# Patient Record
Sex: Female | Born: 1935 | Race: Black or African American | Hispanic: No | Smoking: Never smoker
Health system: Southern US, Community
[De-identification: ages and names within clinical notes are randomized; demographics above are authoritative.]

## PROBLEM LIST (undated history)

## (undated) DIAGNOSIS — F0280 Dementia in other diseases classified elsewhere without behavioral disturbance: Secondary | ICD-10-CM

## (undated) DIAGNOSIS — R339 Retention of urine, unspecified: Secondary | ICD-10-CM

## (undated) DIAGNOSIS — G309 Alzheimer's disease, unspecified: Secondary | ICD-10-CM

## (undated) DIAGNOSIS — I10 Essential (primary) hypertension: Secondary | ICD-10-CM

## (undated) HISTORY — DX: Alzheimer's disease, unspecified: G30.9

## (undated) HISTORY — DX: Essential (primary) hypertension: I10

## (undated) HISTORY — DX: Dementia in other diseases classified elsewhere, unspecified severity, without behavioral disturbance, psychotic disturbance, mood disturbance, and anxiety: F02.80

---

## 2012-08-13 ENCOUNTER — Encounter: Payer: Self-pay | Admitting: *Deleted

## 2012-08-13 ENCOUNTER — Ambulatory Visit: Payer: Self-pay | Admitting: Nurse Practitioner

## 2012-09-03 ENCOUNTER — Encounter (HOSPITAL_COMMUNITY): Payer: Self-pay | Admitting: Emergency Medicine

## 2012-09-03 ENCOUNTER — Emergency Department (HOSPITAL_COMMUNITY)
Admission: EM | Admit: 2012-09-03 | Discharge: 2012-09-03 | Disposition: A | Discharge status: Home / Self Care | Payer: Medicare Other | Attending: Emergency Medicine | Admitting: Emergency Medicine

## 2012-09-03 DIAGNOSIS — R112 Nausea with vomiting, unspecified: Secondary | ICD-10-CM | POA: Insufficient documentation

## 2012-09-03 DIAGNOSIS — R109 Unspecified abdominal pain: Secondary | ICD-10-CM | POA: Insufficient documentation

## 2012-09-03 DIAGNOSIS — F0390 Unspecified dementia without behavioral disturbance: Secondary | ICD-10-CM | POA: Insufficient documentation

## 2012-09-03 DIAGNOSIS — R197 Diarrhea, unspecified: Secondary | ICD-10-CM | POA: Insufficient documentation

## 2012-09-03 LAB — CBC WITH DIFFERENTIAL/PLATELET
Basophils Relative: 0 % (ref 0–1)
Eosinophils Absolute: 0.1 10*3/uL (ref 0.0–0.7)
HCT: 39 % (ref 36.0–46.0)
Hemoglobin: 12.6 g/dL (ref 12.0–15.0)
Lymphs Abs: 0.6 10*3/uL — ABNORMAL LOW (ref 0.7–4.0)
MCH: 26.8 pg (ref 26.0–34.0)
MCHC: 32.3 g/dL (ref 30.0–36.0)
Monocytes Absolute: 0.5 10*3/uL (ref 0.1–1.0)
Monocytes Relative: 4 % (ref 3–12)
Neutro Abs: 10.7 10*3/uL — ABNORMAL HIGH (ref 1.7–7.7)
Neutrophils Relative %: 90 % — ABNORMAL HIGH (ref 43–77)
RBC: 4.71 MIL/uL (ref 3.87–5.11)

## 2012-09-03 LAB — COMPREHENSIVE METABOLIC PANEL
Alkaline Phosphatase: 88 U/L (ref 39–117)
BUN: 25 mg/dL — ABNORMAL HIGH (ref 6–23)
Chloride: 103 mEq/L (ref 96–112)
Creatinine, Ser: 1.39 mg/dL — ABNORMAL HIGH (ref 0.50–1.10)
GFR calc Af Amer: 36 mL/min — ABNORMAL LOW (ref >90)
Glucose, Bld: 131 mg/dL — ABNORMAL HIGH (ref 70–99)
Potassium: 4.2 mEq/L (ref 3.5–5.1)
Total Bilirubin: 0.2 mg/dL — ABNORMAL LOW (ref 0.3–1.2)

## 2012-09-03 LAB — URINE MICROSCOPIC-ADD ON

## 2012-09-03 LAB — URINALYSIS, ROUTINE W REFLEX MICROSCOPIC
Bilirubin Urine: NEGATIVE
Glucose, UA: NEGATIVE mg/dL
Ketones, ur: NEGATIVE mg/dL
Nitrite: NEGATIVE
Specific Gravity, Urine: 1.026 (ref 1.005–1.030)
pH: 5 (ref 5.0–8.0)

## 2012-09-03 LAB — LIPASE, BLOOD: Lipase: 24 U/L (ref 11–59)

## 2012-09-03 MED ORDER — SODIUM CHLORIDE 0.9 % IV SOLN
Freq: Once | INTRAVENOUS | Status: AC
  Administered 2012-09-03: 06:00:00 via INTRAVENOUS

## 2012-09-03 MED ORDER — AMLODIPINE BESYLATE 10 MG PO TABS: 10.0000 mg | ORAL_TABLET | Freq: Every day | ORAL | Status: DC

## 2012-09-03 MED ORDER — ONDANSETRON HCL 4 MG/2ML IJ SOLN
4.0000 mg | Freq: Once | INTRAMUSCULAR | Status: AC
  Administered 2012-09-03: 4 mg via INTRAVENOUS
  Filled 2012-09-03: qty 2

## 2012-09-03 MED ORDER — ONDANSETRON HCL 4 MG PO TABS: 4.0000 mg | ORAL_TABLET | Freq: Four times a day (QID) | ORAL | Status: DC

## 2012-09-03 NOTE — ED Provider Notes (Signed)
Level V caveat patient with Alzheimer's dementia. History is obtained from adult granddaughter. Patient with vomiting and diarrhea onset 1:30 AM today no blood per rectum no hematemesis. There are several other family members with similar symptoms. Patient presently feels well and is asymptomatic on exam alert nontoxic appears comfortable heart regular rate and rhythm lungs clear auscultation abdomen nondistended normal active bowel sounds nontender. Patient sipping on water as I examine her.  Doug Sou, MD 09/03/12 [phone]

## 2012-09-03 NOTE — ED Provider Notes (Signed)
History     CSN: [id number]  Arrival date & time 09/03/12  0865   First MD Initiated Contact with Patient 09/03/12 0636      Chief Complaint  Patient presents with  . Emesis  . Diarrhea    (Consider location/radiation/quality/duration/timing/severity/associated sxs/prior treatment) HPI  79 year old female who was brought via EMS from home to the ED for management of nausea, vomiting, and diarrhea. History was obtained through patient and through family member who was at bedside. Patient initially complaining of abdominal cramping with associate nausea, vomiting, and diarrhea which started last night. Reports 2-3 bouts of nonbloody, non-mucousy diarrhea. Her abdominal cramping has resolved without specific treatment. Nothing makes symptoms better or worse. No complaint of fever, headache, chest pain, shortness of breath, dizziness, lightheadedness, dysuria, or rash. Family member reports that there are several other family members with similar symptoms in the past few days after patient's grandchild visited her house. No recent travel, or eating exotic food.  No past medical history on file.  No past surgical history on file.  No family history on file.  History  Substance Use Topics  . Smoking status: Not on file  . Smokeless tobacco: Not on file  . Alcohol Use: Not on file    OB History   No data available      Review of Systems  Unable to perform ROS: Dementia    Allergies  Review of patient's allergies indicates no known allergies.  Home Medications  No current outpatient prescriptions on file.  BP 161/70  Pulse 71  Temp(Src) 99.3 F (37.4 C) (Oral)  Resp 18  SpO2 100%  Physical Exam  Nursing note and vitals reviewed. Constitutional: She appears well-developed and well-nourished. No distress.  Awake, alert, nontoxic appearance  HENT:  Head: Atraumatic.  Mouth/Throat: Oropharynx is clear and moist.  Eyes: Conjunctivae are normal. Right eye exhibits no  discharge. Left eye exhibits no discharge.  Neck: Neck supple.  Cardiovascular: Normal rate and regular rhythm.   Pulmonary/Chest: Effort normal. No respiratory distress. She exhibits no tenderness.  Abdominal: Soft. There is no tenderness. There is no rebound.  Abdomen is soft and nontender on exam  Musculoskeletal: She exhibits no tenderness.  ROM appears intact, no obvious focal weakness  Neurological: She is alert.  Mental status and motor strength appears intact  Skin: No rash noted.  Psychiatric: She has a normal mood and affect.    ED Course  Procedures (including critical care time)  6:53 AM Patient with symptoms suggestive of viral GI sxs.  She has a soft and nontender abdomen on exam. She does not appears to be dehydrated. She is currently resting. Her granddaughter who brought patient to the ED also had similar symptoms. Plan to continue with hydration.  Will discharge patient once she is able to tolerates by mouth.   7:11 AM Pt able to tolerates PO, sipping on water at this time.  Will recheck vital sign and will d/c with Zofran.  My attending has evaluate pt and agrees with plan.    7:40 AM Pt with asymptomatic hypertension with hx of HTN not having her HTN medication refilled for several months.  She is schedule to be seen by PCP in 2 weeks.  Granddaughter request medication refill Amlodipine 10mg  1 tablet by mouth once daily.  I will refill the prescription.  Labs Reviewed  URINALYSIS, ROUTINE W REFLEX MICROSCOPIC - Abnormal; Notable for the following:    APPearance CLOUDY (*)    Hgb urine dipstick SMALL (*)  Protein, ur >300 (*)    All other components within normal limits  CBC WITH DIFFERENTIAL - Abnormal; Notable for the following:    WBC 11.9 (*)    Neutrophils Relative % 90 (*)    Neutro Abs 10.7 (*)    Lymphocytes Relative 5 (*)    Lymphs Abs 0.6 (*)    All other components within normal limits  COMPREHENSIVE METABOLIC PANEL - Abnormal; Notable for the  following:    Glucose, Bld 131 (*)    BUN 25 (*)    Creatinine, Ser 1.39 (*)    Total Bilirubin 0.2 (*)    GFR calc non Af Amer 31 (*)    GFR calc Af Amer 36 (*)    All other components within normal limits  URINE MICROSCOPIC-ADD ON - Abnormal; Notable for the following:    Bacteria, UA FEW (*)    All other components within normal limits  LIPASE, BLOOD   No results found.   1. Nausea vomiting and diarrhea       MDM  BP 189/61  Pulse 71  Temp(Src) 99.6 F (37.6 C) (Oral)  Resp 18  SpO2 99%  I have reviewed nursing notes and vital signs.  I reviewed available ER/hospitalization records thought the EMR         Fayrene Helper, PA-C 09/03/12 0742  Fayrene Helper, PA-C 09/03/12 [phone]

## 2012-09-03 NOTE — ED Notes (Signed)
Brought in by EMS from home with c/o emesis and diarrhea.  Per EMS, pt started having emesis at around 0130 this morning and then she had loose stool; pt has had no s/s of these prior to bedtime last night, per pt' step-granddaughter.  Pt presents to ED alert and in no s/s apparent distress, denies abdominal pain and nausea at this time.

## 2012-09-04 NOTE — ED Provider Notes (Signed)
Medical screening examination/treatment/procedure(s) were performed by non-physician practitioner and as supervising physician I was immediately available for consultation/collaboration.  Raeford Razor, MD 09/04/12 0600

## 2012-09-08 ENCOUNTER — Encounter: Payer: Self-pay | Admitting: *Deleted

## 2012-09-09 ENCOUNTER — Encounter: Payer: Self-pay | Admitting: Nurse Practitioner

## 2012-09-09 ENCOUNTER — Ambulatory Visit (INDEPENDENT_AMBULATORY_CARE_PROVIDER_SITE_OTHER): Payer: PRIVATE HEALTH INSURANCE | Admitting: Nurse Practitioner

## 2012-09-09 VITALS — BP 172/64 | HR 71 | Temp 98.1°F | Resp 16 | Ht 58.5 in | Wt 172.0 lb

## 2012-09-09 DIAGNOSIS — F0390 Unspecified dementia without behavioral disturbance: Secondary | ICD-10-CM

## 2012-09-09 DIAGNOSIS — I10 Essential (primary) hypertension: Secondary | ICD-10-CM

## 2012-09-09 DIAGNOSIS — R7309 Other abnormal glucose: Secondary | ICD-10-CM

## 2012-09-09 DIAGNOSIS — R197 Diarrhea, unspecified: Secondary | ICD-10-CM

## 2012-09-09 DIAGNOSIS — R739 Hyperglycemia, unspecified: Secondary | ICD-10-CM

## 2012-09-09 NOTE — Progress Notes (Signed)
Patient ID: Bridget Cisneros, female   DOB: November 06, 1916, 79 y.o.   MRN: [id number]  Code status: DNR  No Known Allergies  Chief Complaint  Patient presents with  . New patient    may be diabetic    HPI: Patient is a 79 y.o. female seen in the office today to establish care. All history provided by caregiver.who is pts step granddaughter   Previously pt was living with son and daughter-in- law and now with step- granddaughter; son had her for 2 years and due to the stress she could not take care of her anymore  Pt also has a daughter with dementia and her son was taking her of his sister and his mother Unknown medication history- pts son knows her medical history but he is out of state. Pt has been living with step-granddaughter for 5 months.   Ms Wechter is able to communicate but knows minimal information. Is able to recall her name and month and date of birthday but not able to recall year.   Caregiver reports she complains of chest and throat last night. Went to the ED last week due to nausea, vomiting and diarrhea; received fluids and nausea medication. Still having loose stools  Reports appetite has diminshed within the last few weeks since she has had the GI symptoms.   Pt only on 1 medication- amlodipine 10 mg daily-did not take this morning  Review of Systems:  Unable to get ROS due to advanced dementia   Past Medical History  Diagnosis Date  . Alzheimer's disease   . Hypertension    No past surgical history on file. Social History:   reports that she does not drink alcohol or use illicit drugs. Her tobacco history is not on file.  Family History  Problem Relation Age of Onset  . Family history unknown: Yes    Medications: Patient's Medications  New Prescriptions   No medications on file  Previous Medications   AMLODIPINE (NORVASC) 10 MG TABLET    Take one tablet once daily for blood pressure  Modified Medications   No medications on file  Discontinued Medications   AMLODIPINE (NORVASC) 10 MG TABLET    Take 1 tablet (10 mg total) by mouth daily.   ONDANSETRON (ZOFRAN) 4 MG TABLET    Take 1 tablet (4 mg total) by mouth every 6 (six) hours.     Physical Exam:  Filed Vitals:   09/09/12 0940  BP: 172/64  Pulse: 71  Temp: 98.1 F (36.7 C)  TempSrc: Oral  Resp: 16  Height: 4' 10.5" (1.486 m)  Weight: 172 lb (78.019 kg)  SpO2: 94%   Physical Exam  Constitutional: She is oriented to person, place, and time. She appears well-developed and well-nourished. No distress.  HENT:  Head: Normocephalic and atraumatic.  Eyes: EOM are normal. Pupils are equal, round, and reactive to light.  Neck: Normal range of motion. Neck supple. No thyromegaly present.  Cardiovascular: Normal rate, regular rhythm and normal heart sounds.   Pulmonary/Chest: Effort normal and breath sounds normal.  Abdominal: Soft. Bowel sounds are normal. She exhibits no distension.  Musculoskeletal: Normal range of motion. She exhibits no edema and no tenderness.  Lymphadenopathy:    She has no cervical adenopathy.  Neurological: She is alert and oriented to person, place, and time.  Skin: Skin is warm and dry. She is not diaphoretic.  Psychiatric: She exhibits abnormal recent memory and abnormal remote memory.     Labs reviewed: Basic Metabolic Panel:  Recent Labs  09/03/12 0600  NA 140  K 4.2  CL 103  CO2 27  GLUCOSE 131*  BUN 25*  CREATININE 1.39*  CALCIUM 9.5   Liver Function Tests:  Recent Labs  09/03/12 0600  AST 30  ALT 19  ALKPHOS 88  BILITOT 0.2*  PROT 8.1  ALBUMIN 3.9    Recent Labs  09/03/12 0600  LIPASE 24   No results found for this basename: AMMONIA,  in the last 8760 hours CBC:  Recent Labs  09/03/12 0600  WBC 11.9*  NEUTROABS 10.7*  HGB 12.6  HCT 39.0  MCV 82.8  PLT 305    Assessment/Plan   1.  Essential hypertension, benign 401.1     Pt did not take medication today- educated on taking medications before visits. Will get  blood work today and have pts caregiver bring log to next visit    2.   Dementia, without behavioral disturbance 294.20     Unable to participate in MMSE- pt with advaned dementia    3.   Hyperglycemia 790.29     Will check A1c   4.   Diarrhea   Bland diet - BRAT until symptoms improve then advanced diet as tolerated- encourage fluids with electrolytes, to follow up if symptoms do not improve with in 3 days or get worse; ever or chills occur or pt is not eating or drinking    Labs/tests ordered Aic, cbc, lipids, cmp  Will send new pt pkage to pts son for a more detailed history also will request records from previous practice

## 2012-09-09 NOTE — Patient Instructions (Addendum)
Keep hydrated while having loose stools. May use metamucil to bulk stool Encourage pt to eat at meals and snacks Ensures between meals   Will follow up in 1 month for EV with Dr Renato Gails or Glade Lloyd

## 2012-09-10 LAB — CBC WITH DIFFERENTIAL/PLATELET
Basophils Absolute: 0 10*3/uL (ref 0.0–0.2)
Eosinophils Absolute: 0.1 10*3/uL (ref 0.0–0.4)
Immature Granulocytes: 0 % (ref 0–2)
Lymphs: 27 % (ref 14–46)
MCH: 27 pg (ref 26.6–33.0)
MCHC: 32.3 g/dL (ref 31.5–35.7)
Neutrophils Absolute: 4.2 10*3/uL (ref 1.4–7.0)
Neutrophils Relative %: 63 % (ref 40–74)
RDW: 14.4 % (ref 12.3–15.4)

## 2012-09-10 LAB — LIPID PANEL
Chol/HDL Ratio: 1.9 ratio units (ref 0.0–4.4)
Cholesterol, Total: 116 mg/dL (ref 100–199)
HDL: 60 mg/dL (ref >39)
LDL Calculated: 42 mg/dL (ref 0–99)
Triglycerides: 68 mg/dL (ref 0–149)
VLDL Cholesterol Cal: 14 mg/dL (ref 5–40)

## 2012-09-10 LAB — COMPREHENSIVE METABOLIC PANEL
ALT: 28 IU/L (ref 0–32)
Alkaline Phosphatase: 73 IU/L (ref 39–117)
CO2: 19 mmol/L (ref 19–28)
Chloride: 110 mmol/L — ABNORMAL HIGH (ref 97–108)
GFR calc Af Amer: 45 mL/min/{1.73_m2} — ABNORMAL LOW (ref >59)
Glucose: 93 mg/dL (ref 65–99)
Potassium: 4.1 mmol/L (ref 3.5–5.2)
Total Bilirubin: 0.2 mg/dL (ref 0.0–1.2)
Total Protein: 6.8 g/dL (ref 6.0–8.5)

## 2012-09-10 LAB — LIPASE: Lipase: 55 U/L (ref 0–59)

## 2012-09-10 LAB — HEMOGLOBIN A1C
Est. average glucose Bld gHb Est-mCnc: 123 mg/dL
Hgb A1c MFr Bld: 5.9 % — ABNORMAL HIGH (ref 4.8–5.6)

## 2012-09-12 ENCOUNTER — Emergency Department (HOSPITAL_COMMUNITY): Payer: Medicare Other

## 2012-09-12 ENCOUNTER — Emergency Department (HOSPITAL_COMMUNITY)
Admission: EM | Admit: 2012-09-12 | Discharge: 2012-09-12 | Disposition: A | Discharge status: Home / Self Care | Payer: Medicare Other | Attending: Emergency Medicine | Admitting: Emergency Medicine

## 2012-09-12 ENCOUNTER — Encounter (HOSPITAL_COMMUNITY): Payer: Self-pay | Admitting: *Deleted

## 2012-09-12 DIAGNOSIS — K209 Esophagitis, unspecified without bleeding: Secondary | ICD-10-CM | POA: Insufficient documentation

## 2012-09-12 DIAGNOSIS — Z79899 Other long term (current) drug therapy: Secondary | ICD-10-CM | POA: Insufficient documentation

## 2012-09-12 DIAGNOSIS — G309 Alzheimer's disease, unspecified: Secondary | ICD-10-CM | POA: Insufficient documentation

## 2012-09-12 DIAGNOSIS — I10 Essential (primary) hypertension: Secondary | ICD-10-CM | POA: Insufficient documentation

## 2012-09-12 DIAGNOSIS — R109 Unspecified abdominal pain: Secondary | ICD-10-CM | POA: Insufficient documentation

## 2012-09-12 DIAGNOSIS — F0280 Dementia in other diseases classified elsewhere without behavioral disturbance: Secondary | ICD-10-CM | POA: Insufficient documentation

## 2012-09-12 DIAGNOSIS — R110 Nausea: Secondary | ICD-10-CM | POA: Insufficient documentation

## 2012-09-12 DIAGNOSIS — R079 Chest pain, unspecified: Secondary | ICD-10-CM | POA: Insufficient documentation

## 2012-09-12 DIAGNOSIS — R4789 Other speech disturbances: Secondary | ICD-10-CM | POA: Insufficient documentation

## 2012-09-12 LAB — URINALYSIS, ROUTINE W REFLEX MICROSCOPIC
Leukocytes, UA: NEGATIVE
Nitrite: NEGATIVE
Protein, ur: 100 mg/dL — AB
Urobilinogen, UA: 0.2 mg/dL (ref 0.0–1.0)

## 2012-09-12 LAB — COMPREHENSIVE METABOLIC PANEL
CO2: 24 mEq/L (ref 19–32)
Calcium: 9.5 mg/dL (ref 8.4–10.5)
Creatinine, Ser: 1.17 mg/dL — ABNORMAL HIGH (ref 0.50–1.10)
GFR calc Af Amer: 45 mL/min — ABNORMAL LOW (ref >90)
GFR calc non Af Amer: 39 mL/min — ABNORMAL LOW (ref >90)
Glucose, Bld: 121 mg/dL — ABNORMAL HIGH (ref 70–99)

## 2012-09-12 LAB — CBC WITH DIFFERENTIAL/PLATELET
Basophils Absolute: 0 10*3/uL (ref 0.0–0.1)
Eosinophils Relative: 1 % (ref 0–5)
Lymphocytes Relative: 21 % (ref 12–46)
Neutro Abs: 6.5 10*3/uL (ref 1.7–7.7)
Platelets: 361 10*3/uL (ref 150–400)
RDW: 13.9 % (ref 11.5–15.5)
WBC: 9.1 10*3/uL (ref 4.0–10.5)

## 2012-09-12 MED ORDER — SUCRALFATE 1 G PO TABS: 1.0000 g | ORAL_TABLET | Freq: Four times a day (QID) | ORAL | Status: DC | PRN

## 2012-09-12 MED ORDER — IOHEXOL 300 MG/ML  SOLN
100.0000 mL | Freq: Once | INTRAMUSCULAR | Status: AC | PRN
  Administered 2012-09-12: 100 mL via INTRAVENOUS

## 2012-09-12 MED ORDER — SODIUM CHLORIDE 0.9 % IV SOLN
1000.0000 mL | Freq: Once | INTRAVENOUS | Status: AC
  Administered 2012-09-12: 1000 mL via INTRAVENOUS

## 2012-09-12 MED ORDER — ONDANSETRON HCL 4 MG/2ML IJ SOLN
4.0000 mg | Freq: Once | INTRAMUSCULAR | Status: AC
  Administered 2012-09-12: 4 mg via INTRAVENOUS
  Filled 2012-09-12: qty 2

## 2012-09-12 MED ORDER — LANSOPRAZOLE 15 MG PO TBDP: 15.0000 mg | ORAL_TABLET | Freq: Every day | ORAL | Status: DC

## 2012-09-12 MED ORDER — IOHEXOL 300 MG/ML  SOLN
25.0000 mL | INTRAMUSCULAR | Status: AC
  Administered 2012-09-12: 25 mL via ORAL

## 2012-09-12 MED ORDER — SODIUM CHLORIDE 0.9 % IV SOLN
1000.0000 mL | INTRAVENOUS | Status: DC
  Administered 2012-09-12: 1000 mL via INTRAVENOUS

## 2012-09-12 NOTE — ED Notes (Signed)
Per EMS: pt coming from home with c/o intermittent epigastric and abdominal pain, nausea. EMS reports pt is burping a lot with a smell of feces. 3 lead monitor showed frequent PVC's, 12 lead showed right BBB. Pt is alert and oriented per norm. Pt has hx of dementia. Skin warm and dry, respirations equal and unlabored.

## 2012-09-12 NOTE — ED Provider Notes (Signed)
History    CSN: [id number] Arrival date & time 09/12/12  0157 First MD Initiated Contact with Patient 09/12/12 [phone]      Chief Complaint  Patient presents with  . Abdominal Pain    HPI Pt was getting ready to go to bed when she suddenly started complaining of pain.  She was having discomfort in her chest and epigastric region.  She had another episodes earlier in the day as well.  She had nausea and earlier she appeared to have difficulty breathing and speaking.  Her daughter states that her symptoms have resolved at this point.  She has had diarrhea today.  Past Medical History  Diagnosis Date  . Alzheimer's disease   . Hypertension     History reviewed. No pertinent past surgical history.  History reviewed. No pertinent family history.  History  Substance Use Topics  . Smoking status: Unknown If Ever Smoked  . Smokeless tobacco: Not on file  . Alcohol Use: No    OB History   Grav Para Term Preterm Abortions TAB SAB Ect Mult Living            2      Review of Systems  Constitutional: Negative for fever.  Respiratory: Negative for choking.   Genitourinary: Negative for dysuria.  Neurological: Negative for syncope.    Allergies  Review of patient's allergies indicates no known allergies.  Home Medications   Current Outpatient Rx  Name  Route  Sig  Dispense  Refill  . amLODipine (NORVASC) 10 MG tablet      Take one tablet once daily for blood pressure         . OVER THE COUNTER MEDICATION   Oral   Take by mouth every 4 (four) hours as needed (nausea). Anti nausea medication         . lansoprazole (PREVACID SOLUTAB) 15 MG disintegrating tablet   Oral   Take 1 tablet (15 mg total) by mouth daily.   30 tablet   0   . sucralfate (CARAFATE) 1 G tablet   Oral   Take 1 tablet (1 g total) by mouth 4 (four) times daily as needed (acid reflux).   40 tablet   0     BP 161/74  Pulse 58  Temp(Src) 98.9 F (37.2 C) (Oral)  Resp 27  SpO2 95%  Physical  Exam  Nursing note and vitals reviewed. Constitutional: No distress.  Elderly  HENT:  Head: Normocephalic and atraumatic.  Right Ear: External ear normal.  Left Ear: External ear normal.  Eyes: Conjunctivae are normal. Right eye exhibits no discharge. Left eye exhibits no discharge. No scleral icterus.  Neck: Neck supple. No tracheal deviation present.  Cardiovascular: Normal rate, regular rhythm and intact distal pulses.   Pulmonary/Chest: Effort normal and breath sounds normal. No stridor. No respiratory distress. She has no wheezes. She has no rales.  Abdominal: Soft. Bowel sounds are normal. She exhibits no distension. There is no tenderness. There is no rebound and no guarding.  Musculoskeletal: She exhibits no edema and no tenderness.  Neurological: She is alert. She has normal strength. No sensory deficit. Cranial nerve deficit:  no gross defecits noted. She exhibits normal muscle tone. She displays no seizure activity. Coordination normal.  Skin: Skin is warm and dry. No rash noted. She is not diaphoretic.  Psychiatric: She has a normal mood and affect.    ED Course  Procedures (including critical care time) EKG Normal sinus rhythm rate 68 First degree  AV block Nonspecific intraventricular conduction delay T-wave inversion inferiorly and laterally No prior EKG for comparison Labs Reviewed  COMPREHENSIVE METABOLIC PANEL - Abnormal; Notable for the following:    Glucose, Bld 121 (*)    Creatinine, Ser 1.17 (*)    Total Bilirubin 0.2 (*)    GFR calc non Af Amer 39 (*)    GFR calc Af Amer 45 (*)    All other components within normal limits  URINALYSIS, ROUTINE W REFLEX MICROSCOPIC - Abnormal; Notable for the following:    Hgb urine dipstick SMALL (*)    Protein, ur 100 (*)    All other components within normal limits  URINE MICROSCOPIC-ADD ON - Abnormal; Notable for the following:    Bacteria, UA MANY (*)    All other components within normal limits  LIPASE, BLOOD  CBC  WITH DIFFERENTIAL  POCT I-STAT TROPONIN I   Ct Abdomen Pelvis W Contrast  09/12/2012   *RADIOLOGY REPORT*  Clinical Data: Intermittent epigastric pain and nausea.  CT ABDOMEN AND PELVIS WITH CONTRAST  Technique:  Multidetector CT imaging of the abdomen and pelvis was performed following the standard protocol during bolus administration of intravenous contrast.  Contrast: OMNIPAQUE IOHEXOL 300 MG/ML  SOLN  Comparison: None.  Findings: Atelectasis in the lung bases.  Thickening of the lower esophageal wall which could be due to inflammatory changes or reflux esophagitis.  Sub centimeter low attenuation lesions in the liver likely representing cysts.  No bile duct dilatation.  The gallbladder, pancreas, spleen, adrenal glands, inferior vena cava, and retroperitoneal lymph nodes are unremarkable.  Scattered calcification in the aorta.  No aneurysm.  The stomach, small bowel, and colon are not abnormally distended.  The colon is filled with fluid with some air-fluid levels suggesting liquid stool. This is associated with diarrhea.  No colonic wall thickening.  No free intra-abdominal air.  No free fluid in the abdomen. 2.9 cm cyst in the left kidney.  No solid mass or hydronephrosis identified.  Pelvis:  There is a large cystic mass in the left adnexal region measuring 6.7 x 5.8 cm.  The appearance suggests cystic ovarian neoplasm.  Ultrasound is recommended for further characterization. Uterus is not enlarged.  Bladder wall is not thickened.  No free or loculated pelvic fluid collections.  The appendix is not identified.  No evidence of diverticulitis.  No significant pelvic lymphadenopathy.  Degenerative changes in the lumbar spine.  IMPRESSION: Cystic mass in the left adnexa.  Ultrasound recommended for better characterization. Esophageal wall thickening suggesting evidence of reflux disease.   Original Report Authenticated By: Burman Nieves, M.D.   Dg Abd Acute W/chest  09/12/2012   *RADIOLOGY REPORT*   Clinical Data: Abdominal and epigastric pain.  ACUTE ABDOMEN SERIES (ABDOMEN 2 VIEW & CHEST 1 VIEW)  Comparison: None.  Findings: The heart size and pulmonary vascularity are normal. The lungs appear clear and expanded without focal air space disease or consolidation. No blunting of the costophrenic angles.  No pneumothorax.  Mediastinal contours appear intact.  Tortuous aorta. Degenerative changes in the spine.  Gas and stool scattered throughout the colon with some gas filled small bowel loops.  No small or large bowel distension.  No free intra-abdominal air.  No abnormal air fluid levels.  Air-fluid levels in the right colon likely represent liquid stool.  No radiopaque stones.  Calcified phleboliths in the pelvis. Degenerative changes in the lumbar spine and hips.  IMPRESSION: No evidence of active pulmonary disease.  Nonobstructive bowel gas  pattern.   Original Report Authenticated By: Burman Nieves, M.D.     1. Esophagitis       MDM  CT findings suggest esophagitis which is consistent with her symptoms this evening.  Pt has been symptom free here in the ED.  Will dc home on antacids follow up with PCP.  Incidental finding on CT noted regarding ovary.  Discussed findings with family.  I do not think that further evaluation is necessary at the patient's age  Doubt ACS, PE or other acute emergency medical condition at this time.        Celene Kras, MD 09/12/12 [phone]

## 2012-09-12 NOTE — ED Notes (Signed)
Family at bedside. 

## 2012-09-24 ENCOUNTER — Ambulatory Visit: Payer: Self-pay | Admitting: Nurse Practitioner

## 2012-10-16 ENCOUNTER — Encounter: Payer: Self-pay | Admitting: Internal Medicine

## 2012-10-16 ENCOUNTER — Ambulatory Visit (INDEPENDENT_AMBULATORY_CARE_PROVIDER_SITE_OTHER): Payer: PRIVATE HEALTH INSURANCE | Admitting: Internal Medicine

## 2012-10-16 VITALS — BP 152/74 | HR 61 | Temp 98.2°F | Resp 14 | Ht 58.5 in | Wt 169.6 lb

## 2012-10-16 DIAGNOSIS — R197 Diarrhea, unspecified: Secondary | ICD-10-CM | POA: Insufficient documentation

## 2012-10-16 DIAGNOSIS — I10 Essential (primary) hypertension: Secondary | ICD-10-CM

## 2012-10-16 DIAGNOSIS — R739 Hyperglycemia, unspecified: Secondary | ICD-10-CM

## 2012-10-16 DIAGNOSIS — F0390 Unspecified dementia without behavioral disturbance: Secondary | ICD-10-CM

## 2012-10-16 DIAGNOSIS — R7309 Other abnormal glucose: Secondary | ICD-10-CM

## 2012-10-16 MED ORDER — AMLODIPINE BESYLATE 10 MG PO TABS: 10.0000 mg | ORAL_TABLET | Freq: Every day | ORAL | Status: DC

## 2012-10-16 NOTE — Assessment & Plan Note (Signed)
hba1c is borderline at this point.  Goals of care are good quality of life at this point.  I would not limit her diet too much at this point.  Family says she has lost weight though she remains overweight.

## 2012-10-16 NOTE — Assessment & Plan Note (Signed)
Should have MMSE next time.  I think she can answer the questions.  She is able to have a conversation with me when her family is NOT in the room.  Has had workup with normal lipids, TSH, cbc, bmp.  Should have b12/folate and RPR at some point.  She also should have a CT brain to assess for vascular changes.  I suspect a vascular etiology with her hypertension, obesity and hyperglycemia.

## 2012-10-16 NOTE — Progress Notes (Signed)
Patient ID: Bridget Cisneros, female   DOB: June 01, 1916, 79 y.o.   MRN: [id number] Location:  [facility] / [place] [facility]  Code Status: need to address this  No Known Allergies  Chief Complaint  Patient presents with  . Annual Exam    HPI: Patient is a 79 y.o. female seen in the office today for med mgt of chronic diseases including dementia and hypertension and her annual physical.  Her labs were reviewed with mild elevation of glucose and creatinine.  Denies pain.  Went to ED in June.  Had virus a week before and then was having severe reflux.  Diagnosed as esophagitis.  Was bothering her when she laid down.  Confused frequently about family still being alive that are deceased.  Worries about things going on outside.  Doesn't remember what she's told to do.  Sometimes sitting on the bed naked with blinds open.  Doesn't recall that she opened blinds or took off her clothes.  Has a good appetite.  Runs to bathroom if has too much milk in cereal.  Too much coffee also affects her and ice cream.  She gets diarrhea with these.  Plays with stool and dirty toilet tissue.  Also was touching the baby (grandbaby) after touching her stools.  Has to be reminded to clean her hands.  Is getting more kyphosis.  Is losing weight.  Out of blood pressure medicine today so did not take.    Review of Systems:  Review of Systems  Constitutional: Negative for fever, chills and malaise/fatigue.  HENT: Negative for congestion.   Eyes: Negative for blurred vision.  Respiratory: Negative for cough and shortness of breath.   Cardiovascular: Negative for chest pain, palpitations and leg swelling.  Gastrointestinal: Positive for diarrhea. Negative for heartburn, constipation and blood in stool.       Diarrhea is associated with milk products, coffee  Genitourinary: Negative for dysuria.       Incontinence of urine  Musculoskeletal: Negative for myalgias and falls.  Skin: Negative for rash.   Neurological: Negative for dizziness, sensory change, weakness and headaches.  Psychiatric/Behavioral: Positive for memory loss.    Past Medical History  Diagnosis Date  . Alzheimer's disease   . Hypertension    History reviewed. No pertinent past surgical history.  Social History:   reports that she does not drink alcohol or use illicit drugs. Her tobacco history is not on file.  History reviewed. No pertinent family history.  Medications: Patient's Medications  New Prescriptions   No medications on file  Previous Medications   No medications on file  Modified Medications   Modified Medication Previous Medication   AMLODIPINE (NORVASC) 10 MG TABLET amLODipine (NORVASC) 10 MG tablet      Take 1 tablet (10 mg total) by mouth daily. Take one tablet once daily for blood pressure    Take one tablet once daily for blood pressure  Discontinued Medications   LANSOPRAZOLE (PREVACID SOLUTAB) 15 MG DISINTEGRATING TABLET    Take 1 tablet (15 mg total) by mouth daily.   OVER THE COUNTER MEDICATION    Take by mouth every 4 (four) hours as needed (nausea). Anti nausea medication   SUCRALFATE (CARAFATE) 1 G TABLET    Take 1 tablet (1 g total) by mouth 4 (four) times daily as needed (acid reflux).     Physical Exam: Filed Vitals:   10/16/12 1015  BP: 152/74  Pulse: 61  Temp: 98.2 F (36.8 C)  TempSrc:  Oral  Resp: 14  Height: 4' 10.5" (1.486 m)  Weight: 169 lb 9.6 oz (76.93 kg)  Physical Exam  Constitutional:  Obese black female, NAD  HENT:  Head: Normocephalic and atraumatic.  Right Ear: External ear normal.  Left Ear: External ear normal.  Nose: Nose normal.  Mouth/Throat: Oropharynx is clear and moist.  Upper and lower dentures in place  Eyes: Conjunctivae and EOM are normal. Pupils are equal, round, and reactive to light. Right eye exhibits no discharge. Left eye exhibits no discharge. No scleral icterus.  Arcus senilensis  Neck: Normal range of motion. Neck supple. No  JVD present. No tracheal deviation present. No thyromegaly present.  Cardiovascular: Normal heart sounds and intact distal pulses.   No murmur heard. Irregularly irregular  Pulmonary/Chest: Effort normal and breath sounds normal. No respiratory distress. She has no wheezes. She has no rales. She exhibits no mass and no tenderness. Right breast exhibits no inverted nipple, no mass, no nipple discharge, no skin change and no tenderness. Left breast exhibits no inverted nipple, no mass, no nipple discharge, no skin change and no tenderness. Breasts are symmetrical. There is no breast swelling.  Large, pendulous breasts, difficult to examine  Abdominal: Soft. Bowel sounds are normal. She exhibits no distension and no mass. There is no tenderness. There is no rebound and no guarding. No hernia.  Musculoskeletal: Normal range of motion. She exhibits no edema and no tenderness.  Lymphadenopathy:    She has no cervical adenopathy.  Neurological: She is alert. She has normal reflexes. No cranial nerve deficit.  Oriented to person only  Skin: Skin is warm and dry.  Psychiatric: She has a normal mood and affect.  DRE deferred   Labs reviewed: Basic Metabolic Panel:  Recent Labs  16/10/96 0600 09/09/12 1119 09/12/12 0231  NA 140 145* 141  K 4.2 4.1 3.5  CL 103 110* 108  CO2 27 19 24   GLUCOSE 131* 93 121*  BUN 25* 17 22  CREATININE 1.39* 1.19* 1.17*  CALCIUM 9.5 9.4 9.5  TSH  --  1.370  --    Liver Function Tests:  Recent Labs  09/03/12 0600 09/09/12 1119 09/12/12 0231  AST 30 27 26   ALT 19 28 20   ALKPHOS 88 73 77  BILITOT 0.2* 0.2 0.2*  PROT 8.1 6.8 7.1  ALBUMIN 3.9  --  3.5    Recent Labs  09/03/12 0600 09/09/12 1119 09/12/12 0231  LIPASE 24 55 36  AMYLASE  --  150*  --    No results found for this basename: AMMONIA,  in the last 8760 hours CBC:  Recent Labs  09/03/12 0600 09/09/12 1119 09/12/12 0231  WBC 11.9* 6.5 9.1  NEUTROABS 10.7* 4.2 6.5  HGB 12.6 11.9  12.4  HCT 39.0 36.8 37.6  MCV 82.8 84 81.2  PLT 305  --  361   Lipid Panel:  Recent Labs  09/09/12 1119  HDL 60  LDLCALC 42  TRIG 68  CHOLHDL 1.9   Lab Results  Component Value Date   HGBA1C 5.9* 09/09/2012   Assessment/Plan Dementia Should have MMSE next time.  I think she can answer the questions.  She is able to have a conversation with me when her family is NOT in the room.  Has had workup with normal lipids, TSH, cbc, bmp.  Should have b12/folate and RPR at some point.  She also should have a CT brain to assess for vascular changes.  I suspect a vascular etiology with  her hypertension, obesity and hyperglycemia.    Diarrhea Persists, but caregiver has noted that only certain foods cause it--milk products and coffee especially.  Advised to avoid these if they are causing loose stools.  Essential hypertension, benign Did not have her bp med this am.  I faxed it to the pharmacy today b/c apparently they were out.  Importance of taking it daily was emphasized.    Hyperglycemia hba1c is borderline at this point.  Goals of care are good quality of life at this point.  I would not limit her diet too much at this point.  Family says she has lost weight though she remains overweight.     Labs/tests ordered:  None today;  Recommend b12/folate, RPR, CT brain and MMSE next visit.   Next appt:  6 mos with Shanda Bumps

## 2012-10-16 NOTE — Assessment & Plan Note (Signed)
Did not have her bp med this am.  I faxed it to the pharmacy today b/c apparently they were out.  Importance of taking it daily was emphasized.

## 2012-10-16 NOTE — Assessment & Plan Note (Signed)
Persists, but caregiver has noted that only certain foods cause it--milk products and coffee especially.  Advised to avoid these if they are causing loose stools.

## 2012-12-04 ENCOUNTER — Telehealth: Payer: Self-pay | Admitting: *Deleted

## 2012-12-04 NOTE — Telephone Encounter (Signed)
Patient caregiver called and stated that patient did not want to take her BP medication this morning and kept spitting it out when the caregiver gave it to her and patient started crying. Told caregiver to try and give it in applesauce and see if she would take it that way any better. And if she continued giving her a hard time about taking it or becomes combative to call and make an appointment with the provider. Caregiver Angelique Blonder agreed.

## 2013-02-18 ENCOUNTER — Ambulatory Visit: Payer: PRIVATE HEALTH INSURANCE | Admitting: Podiatrist

## 2013-03-11 ENCOUNTER — Ambulatory Visit (INDEPENDENT_AMBULATORY_CARE_PROVIDER_SITE_OTHER): Payer: PRIVATE HEALTH INSURANCE | Admitting: Podiatrist

## 2013-03-11 ENCOUNTER — Encounter: Payer: Self-pay | Admitting: Podiatrist

## 2013-03-11 VITALS — BP 155/76 | HR 74 | Resp 12 | Ht 61.0 in | Wt 180.0 lb

## 2013-03-11 DIAGNOSIS — B351 Tinea unguium: Secondary | ICD-10-CM

## 2013-03-11 DIAGNOSIS — M79609 Pain in unspecified limb: Secondary | ICD-10-CM

## 2013-03-11 NOTE — Progress Notes (Deleted)
   Subjective:    Patient ID: Bridget Cisneros, female    DOB: 1916/10/15, 79 y.o.   MRN: [id number]  HPI Comments: '' toenails trim''     Review of Systems     Objective:   Physical Exam        Assessment & Plan:

## 2013-03-11 NOTE — Progress Notes (Signed)
   Subjective:    Patient ID: Bridget Cisneros, female    DOB: October 21, 1916, 79 y.o.   MRN: [id number]  HPI    Review of Systems  Psychiatric/Behavioral: Positive for confusion.  All other systems reviewed and are negative.       Objective:   Physical Exam Vascular status intact with palpable pedal pulses 2/4 dp, 1/4 pt bilateral. Capillary refill time is within normal limits bilateral. Neurological sensation is intact via Semmes Weinstein monofilament at 5 out of 5 sites. Patient's toenails are significantly long discolored dystrophic clinically mycotic and symptomatic.        Assessment & Plan:  Symptomatic mycotic nails All 10 toenails are treated today without complication. She'll be seen back in 3 months or as needed for followup.

## 2013-04-15 ENCOUNTER — Other Ambulatory Visit: Payer: PRIVATE HEALTH INSURANCE

## 2013-04-17 ENCOUNTER — Ambulatory Visit: Payer: PRIVATE HEALTH INSURANCE | Admitting: Internal Medicine

## 2013-05-13 ENCOUNTER — Other Ambulatory Visit: Payer: Self-pay | Admitting: Internal Medicine

## 2013-05-18 ENCOUNTER — Other Ambulatory Visit: Payer: Self-pay

## 2013-05-18 DIAGNOSIS — F0390 Unspecified dementia without behavioral disturbance: Secondary | ICD-10-CM

## 2013-05-18 DIAGNOSIS — I10 Essential (primary) hypertension: Secondary | ICD-10-CM

## 2013-05-18 DIAGNOSIS — R739 Hyperglycemia, unspecified: Secondary | ICD-10-CM

## 2013-05-19 LAB — BASIC METABOLIC PANEL
BUN/Creatinine Ratio: 16 (ref 11–26)
BUN: 21 mg/dL (ref 10–36)
CO2: 21 mmol/L (ref 18–29)
Calcium: 9.4 mg/dL (ref 8.7–10.3)
Chloride: 110 mmol/L — ABNORMAL HIGH (ref 97–108)
Creatinine, Ser: 1.32 mg/dL — ABNORMAL HIGH (ref 0.57–1.00)
GFR calc Af Amer: 40 mL/min/{1.73_m2} — ABNORMAL LOW (ref >59)
GFR calc non Af Amer: 34 mL/min/{1.73_m2} — ABNORMAL LOW (ref >59)
Glucose: 91 mg/dL (ref 65–99)
Potassium: 4.4 mmol/L (ref 3.5–5.2)
Sodium: 145 mmol/L — ABNORMAL HIGH (ref 134–144)

## 2013-05-19 LAB — CBC WITH DIFFERENTIAL/PLATELET
Basophils Absolute: 0 10*3/uL (ref 0.0–0.2)
Basos: 0 %
Eos: 3 %
Eosinophils Absolute: 0.2 10*3/uL (ref 0.0–0.4)
HCT: 35 % (ref 34.0–46.6)
Hemoglobin: 11.1 g/dL (ref 11.1–15.9)
Immature Grans (Abs): 0 10*3/uL (ref 0.0–0.1)
Immature Granulocytes: 0 %
Lymphocytes Absolute: 2.4 10*3/uL (ref 0.7–3.1)
Lymphs: 32 %
MCH: 26.4 pg — ABNORMAL LOW (ref 26.6–33.0)
MCHC: 31.7 g/dL (ref 31.5–35.7)
MCV: 83 fL (ref 79–97)
Monocytes Absolute: 0.6 10*3/uL (ref 0.1–0.9)
Monocytes: 7 %
Neutrophils Absolute: 4.4 10*3/uL (ref 1.4–7.0)
Neutrophils Relative %: 58 %
RBC: 4.21 x10E6/uL (ref 3.77–5.28)
RDW: 15 % (ref 12.3–15.4)
WBC: 7.6 10*3/uL (ref 3.4–10.8)

## 2013-05-19 LAB — HEMOGLOBIN A1C
Est. average glucose Bld gHb Est-mCnc: 120 mg/dL
Hgb A1c MFr Bld: 5.8 % — ABNORMAL HIGH (ref 4.8–5.6)

## 2013-05-21 ENCOUNTER — Ambulatory Visit: Payer: PRIVATE HEALTH INSURANCE | Admitting: Internal Medicine

## 2013-06-18 ENCOUNTER — Ambulatory Visit: Payer: PRIVATE HEALTH INSURANCE | Admitting: Podiatrist

## 2013-07-10 ENCOUNTER — Encounter: Payer: Self-pay | Admitting: Internal Medicine

## 2013-07-10 ENCOUNTER — Ambulatory Visit (INDEPENDENT_AMBULATORY_CARE_PROVIDER_SITE_OTHER): Payer: PRIVATE HEALTH INSURANCE | Admitting: Internal Medicine

## 2013-07-10 VITALS — BP 152/88 | HR 70 | Temp 98.7°F | Resp 10 | Wt 173.0 lb

## 2013-07-10 DIAGNOSIS — I10 Essential (primary) hypertension: Secondary | ICD-10-CM

## 2013-07-10 DIAGNOSIS — R739 Hyperglycemia, unspecified: Secondary | ICD-10-CM

## 2013-07-10 DIAGNOSIS — R7309 Other abnormal glucose: Secondary | ICD-10-CM

## 2013-07-10 DIAGNOSIS — N183 Chronic kidney disease, stage 3 unspecified: Secondary | ICD-10-CM | POA: Insufficient documentation

## 2013-07-10 DIAGNOSIS — N179 Acute kidney failure, unspecified: Secondary | ICD-10-CM | POA: Insufficient documentation

## 2013-07-10 DIAGNOSIS — F0280 Dementia in other diseases classified elsewhere without behavioral disturbance: Secondary | ICD-10-CM

## 2013-07-10 DIAGNOSIS — G309 Alzheimer's disease, unspecified: Principal | ICD-10-CM

## 2013-07-10 MED ORDER — AMLODIPINE BESYLATE 10 MG PO TABS: ORAL_TABLET | ORAL | Status: DC
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
## 2013-12-17 ENCOUNTER — Telehealth: Payer: Self-pay | Admitting: *Deleted

## 2013-12-17 MED ORDER — AMBULATORY NON FORMULARY MEDICATION: Status: DC

## 2013-12-17 NOTE — Telephone Encounter (Signed)
Okay to give Rx

## 2013-12-17 NOTE — Telephone Encounter (Signed)
Faxed Rx to pharmacy  

## 2013-12-17 NOTE — Telephone Encounter (Signed)
Patient caregiver wants a Rx for Depends Size Large called into pharmacy. Please Advise.

## 2013-12-22 ENCOUNTER — Other Ambulatory Visit: Payer: Self-pay | Admitting: *Deleted

## 2013-12-22 MED ORDER — AMBULATORY NON FORMULARY MEDICATION: Status: AC

## 2013-12-22 MED ORDER — AMBULATORY NON FORMULARY MEDICATION: Status: DC

## 2013-12-22 NOTE — Telephone Encounter (Signed)
Refaxed Rx to pharmacy per caregiver, Angelique Blonder

## 2014-01-20 ENCOUNTER — Other Ambulatory Visit: Payer: PRIVATE HEALTH INSURANCE

## 2014-01-22 ENCOUNTER — Encounter: Payer: PRIVATE HEALTH INSURANCE | Admitting: Internal Medicine

## 2014-02-17 ENCOUNTER — Telehealth: Payer: Self-pay | Admitting: *Deleted

## 2014-02-17 NOTE — Telephone Encounter (Signed)
Caregiver, Angelique BlonderDenise called and stated that patient is 80 years old and has the "runs" since yesterday and messing up multiple cloths. Patient has had a fever for the past 3 days. She stated that patient is gray in color. And her Right ankle is dark. I advised Caregiver to take patient to the ER to be evaluated with her age and concerns for dehydration. Caregiver agreed.

## 2014-02-17 NOTE — Telephone Encounter (Signed)
I agree

## 2014-02-18 ENCOUNTER — Other Ambulatory Visit: Payer: Self-pay | Admitting: Internal Medicine

## 2014-02-20 ENCOUNTER — Emergency Department (HOSPITAL_COMMUNITY)
Admission: EM | Admit: 2014-02-20 | Discharge: 2014-02-20 | Disposition: A | Discharge status: Home / Self Care | Payer: Medicare Other | Attending: Emergency Medicine | Admitting: Emergency Medicine

## 2014-02-20 ENCOUNTER — Emergency Department (HOSPITAL_COMMUNITY): Payer: Medicare Other

## 2014-02-20 ENCOUNTER — Encounter (HOSPITAL_COMMUNITY): Payer: Self-pay | Admitting: Emergency Medicine

## 2014-02-20 DIAGNOSIS — M79672 Pain in left foot: Secondary | ICD-10-CM | POA: Insufficient documentation

## 2014-02-20 DIAGNOSIS — R52 Pain, unspecified: Secondary | ICD-10-CM

## 2014-02-20 DIAGNOSIS — G309 Alzheimer's disease, unspecified: Secondary | ICD-10-CM | POA: Diagnosis not present

## 2014-02-20 DIAGNOSIS — I10 Essential (primary) hypertension: Secondary | ICD-10-CM | POA: Diagnosis not present

## 2014-02-20 LAB — BASIC METABOLIC PANEL
ANION GAP: 17 — AB (ref 5–15)
BUN: 23 mg/dL (ref 6–23)
CALCIUM: 9.2 mg/dL (ref 8.4–10.5)
CHLORIDE: 102 meq/L (ref 96–112)
CO2: 21 mEq/L (ref 19–32)
CREATININE: 1.63 mg/dL — AB (ref 0.50–1.10)
GFR calc non Af Amer: 26 mL/min — ABNORMAL LOW (ref >90)
GFR, EST AFRICAN AMERICAN: 30 mL/min — AB (ref >90)
Glucose, Bld: 125 mg/dL — ABNORMAL HIGH (ref 70–99)
Potassium: 3.9 mEq/L (ref 3.7–5.3)
SODIUM: 140 meq/L (ref 137–147)

## 2014-02-20 LAB — CBC WITH DIFFERENTIAL/PLATELET
BASOS ABS: 0 10*3/uL (ref 0.0–0.1)
BASOS PCT: 0 % (ref 0–1)
EOS PCT: 1 % (ref 0–5)
Eosinophils Absolute: 0.1 10*3/uL (ref 0.0–0.7)
HEMATOCRIT: 32 % — AB (ref 36.0–46.0)
Hemoglobin: 10.4 g/dL — ABNORMAL LOW (ref 12.0–15.0)
Lymphocytes Relative: 5 % — ABNORMAL LOW (ref 12–46)
Lymphs Abs: 0.6 10*3/uL — ABNORMAL LOW (ref 0.7–4.0)
MCH: 26.1 pg (ref 26.0–34.0)
MCHC: 32.5 g/dL (ref 30.0–36.0)
MCV: 80.2 fL (ref 78.0–100.0)
MONO ABS: 0.9 10*3/uL (ref 0.1–1.0)
Monocytes Relative: 7 % (ref 3–12)
NEUTROS ABS: 10.9 10*3/uL — AB (ref 1.7–7.7)
Neutrophils Relative %: 87 % — ABNORMAL HIGH (ref 43–77)
PLATELETS: 388 10*3/uL (ref 150–400)
RBC: 3.99 MIL/uL (ref 3.87–5.11)
RDW: 13.4 % (ref 11.5–15.5)
WBC: 12.4 10*3/uL — AB (ref 4.0–10.5)

## 2014-02-20 MED ORDER — TRAMADOL HCL 50 MG PO TABS
50.0000 mg | ORAL_TABLET | Freq: Two times a day (BID) | ORAL | Status: DC | PRN
Start: 2014-02-20 — End: 2014-06-07

## 2014-02-20 MED ORDER — CEPHALEXIN 500 MG PO CAPS: 500.0000 mg | ORAL_CAPSULE | Freq: Three times a day (TID) | ORAL | Status: DC

## 2014-02-20 NOTE — ED Provider Notes (Signed)
CSN: [id number]     Arrival date & time 02/20/14  1116 History   First MD Initiated Contact with Patient 02/20/14 1120     Chief Complaint  Patient presents with  . Foot Pain    HPI Patient presents to the emergency room with complaints of left foot pain. The patient lives at home with her family members who help care for her. Patient's able to walk around usually with a little bit of assistance. Friday she started having pain in her left leg. It was uncomfortable when she tried to walk so she has not been walking as much. Family does not recall any injuries. Patient has not had any fevers or chills. She is not having trouble with any chest pain or shortness of breath. She does not have a history of similar symptoms in the past.  Past Medical History  Diagnosis Date  . Alzheimer's disease   . Hypertension    History reviewed. No pertinent past surgical history. No family history on file. History  Substance Use Topics  . Smoking status: Unknown If Ever Smoked  . Smokeless tobacco: Not on file  . Alcohol Use: No   OB History    Gravida Para Term Preterm AB TAB SAB Ectopic Multiple Living            2     Review of Systems  All other systems reviewed and are negative.     Allergies  Review of patient's allergies indicates no known allergies.  Home Medications   Prior to Admission medications   Medication Sig Start Date End Date Taking? Authorizing Provider  amLODipine (NORVASC) 10 MG tablet TAKE ONE TABLET BY MOUTH ONCE DAILY FOR BLOOD PRESSURE 02/18/14  Yes Tiffany L Reed, DO  AMBULATORY NON FORMULARY MEDICATION Depends Size Large Dx: 585.3 and 331.0 12/22/13   Oneal GroutMahima Pandey, MD  cephALEXin (KEFLEX) 500 MG capsule Take 1 capsule (500 mg total) by mouth 3 (three) times daily. 02/20/14   Linwood DibblesJon Aveyah Greenwood, MD  traMADol (ULTRAM) 50 MG tablet Take 1 tablet (50 mg total) by mouth every 12 (twelve) hours as needed. 02/20/14   Linwood DibblesJon Jaquetta Currier, MD   BP 145/94 mmHg  Pulse 113  Temp(Src) 99 F  (37.2 C) (Oral)  Resp 28  Ht 5\' 2"  (1.575 m)  Wt 173 lb (78.472 kg)  BMI 31.63 kg/m2  SpO2 97% Physical Exam  Constitutional: No distress.  Elderly  HENT:  Head: Normocephalic and atraumatic.  Right Ear: External ear normal.  Left Ear: External ear normal.  Eyes: Conjunctivae are normal. Right eye exhibits no discharge. Left eye exhibits no discharge. No scleral icterus.  Neck: Neck supple. No tracheal deviation present.  Cardiovascular: Normal rate, regular rhythm and intact distal pulses.   Pulmonary/Chest: Effort normal and breath sounds normal. No stridor. No respiratory distress. She has no wheezes. She has no rales.  Abdominal: Soft. Bowel sounds are normal. She exhibits no distension. There is no tenderness. There is no rebound and no guarding.  Musculoskeletal: She exhibits tenderness. She exhibits no edema.       Left ankle: Normal.       Left upper leg: She exhibits no tenderness, no bony tenderness, no swelling and no edema.       Left lower leg: She exhibits no tenderness, no bony tenderness, no swelling and no edema.       Right foot: There is tenderness and bony tenderness. There is no swelling, no deformity and no laceration.  Feet:  Focal area of erythema and mild edema in the left mid foot, tenderness to palpation, distal profusion appears to be normal without cyanosis  Neurological: She is alert. She has normal strength. No cranial nerve deficit (no facial droop, extraocular movements intact, no slurred speech) or sensory deficit. She exhibits normal muscle tone. She displays no seizure activity. Coordination normal.  Skin: Skin is warm and dry. No rash noted. She is not diaphoretic.  Psychiatric: She has a normal mood and affect.  Nursing note and vitals reviewed.   ED Course  Procedures (including critical care time) Labs Review Labs Reviewed  CBC WITH DIFFERENTIAL - Abnormal; Notable for the following:    WBC 12.4 (*)    Hemoglobin 10.4 (*)    HCT  32.0 (*)    Neutrophils Relative % 87 (*)    Neutro Abs 10.9 (*)    Lymphocytes Relative 5 (*)    Lymphs Abs 0.6 (*)    All other components within normal limits  BASIC METABOLIC PANEL - Abnormal; Notable for the following:    Glucose, Bld 125 (*)    Creatinine, Ser 1.63 (*)    GFR calc non Af Amer 26 (*)    GFR calc Af Amer 30 (*)    Anion gap 17 (*)    All other components within normal limits    Imaging Review Dg Foot Complete Left  02/20/2014   CLINICAL DATA:  Left foot pain which started on Friday. No known injury.  EXAM: LEFT FOOT - COMPLETE 3+ VIEW  COMPARISON:  None.  FINDINGS: Mild degenerative changes and hallux valgus at the left first MTP joint. No acute bony abnormality. Specifically, no fracture, subluxation, or dislocation. Soft tissues are intact.  IMPRESSION: No acute bony abnormality.   Electronically Signed   By: Charlett NoseKevin  Dover M.D.   On: 02/20/2014 13:09   Mild tachycardia noted on arrival. Patient is comfortable in no distress.  MDM   Final diagnoses:  Left foot pain    Patient does have a small amount of erythema on her left midfoot. This may be related to a cellulitis. X-rays are normal. I doubt septic arthritis or deep space infection.  I'll discharge her home on a course of oral antibiotics. Keep the leg elevated. Follow-up with her doctor this week to be rechecked  Linwood DibblesJon Sherrina Zaugg, MD 02/20/14 1352

## 2014-02-20 NOTE — ED Notes (Signed)
Granddaughter stated, she started she is complaining of left foot pain on  Friday and not being able to walk on it.  Pt. Helped out of the car with a wheelchair.

## 2014-02-20 NOTE — Discharge Instructions (Signed)

## 2014-02-22 ENCOUNTER — Telehealth: Payer: Self-pay

## 2014-02-22 NOTE — Telephone Encounter (Signed)
Pt needs follow up with a provider in the office in the next week to follow up foot      ----- Message -----     From: SYSTEM     Sent: 02/20/2014  2:15 PM      To: Sharon SellerJessica K Eubanks, NP    Called patient to schedule appointment for ER follow-up, patient's caregiver will call back to schedule

## 2014-02-23 ENCOUNTER — Encounter: Payer: Self-pay | Admitting: Internal Medicine

## 2014-02-23 ENCOUNTER — Ambulatory Visit (INDEPENDENT_AMBULATORY_CARE_PROVIDER_SITE_OTHER): Payer: Medicare Other | Admitting: Internal Medicine

## 2014-02-23 VITALS — BP 140/72 | HR 95 | Temp 99.3°F | Resp 10 | Wt 166.0 lb

## 2014-02-23 DIAGNOSIS — M25572 Pain in left ankle and joints of left foot: Secondary | ICD-10-CM

## 2014-02-23 DIAGNOSIS — R739 Hyperglycemia, unspecified: Secondary | ICD-10-CM

## 2014-02-23 DIAGNOSIS — M25579 Pain in unspecified ankle and joints of unspecified foot: Secondary | ICD-10-CM | POA: Insufficient documentation

## 2014-02-23 DIAGNOSIS — N183 Chronic kidney disease, stage 3 unspecified: Secondary | ICD-10-CM

## 2014-02-23 DIAGNOSIS — D649 Anemia, unspecified: Secondary | ICD-10-CM

## 2014-02-23 DIAGNOSIS — I10 Essential (primary) hypertension: Secondary | ICD-10-CM

## 2014-02-23 MED ORDER — DICLOFENAC 18 MG PO CAPS: ORAL_CAPSULE | ORAL | Status: DC

## 2014-02-23 NOTE — Progress Notes (Signed)
Patient ID: Bridget Cisneros, female   DOB: 04-22-1916, 80 y.o.   MRN: [id number]    Facility  [facility]    Place of Service:   OFFICE   No Known Allergies  Chief Complaint  Patient presents with  . Follow-up    ER follow-up (cellulitis on left leg)      HPI:  Pain in joint, ankle and foot, left: Seen in the emergency room 02/20/14. X-ray was done of the left foot, but it did not show anything of significance. Patient was told she had a cellulitis. She was started on antibiotics. She has continued discomfort of the foot is finding it difficult to walk. She is otherwise a poor historian because of her Alzheimer's disease. There have been no fevers or chills. There is no history of gout.    Medications: Patient's Medications  New Prescriptions   No medications on file  Previous Medications   AMBULATORY NON FORMULARY MEDICATION    Depends Size Large Dx: 585.3 and 331.0   AMLODIPINE (NORVASC) 10 MG TABLET    TAKE ONE TABLET BY MOUTH ONCE DAILY FOR BLOOD PRESSURE   CEPHALEXIN (KEFLEX) 500 MG CAPSULE    Take 1 capsule (500 mg total) by mouth 3 (three) times daily.   TRAMADOL (ULTRAM) 50 MG TABLET    Take 1 tablet (50 mg total) by mouth every 12 (twelve) hours as needed.  Modified Medications   No medications on file  Discontinued Medications   No medications on file     Review of Systems  Constitutional: Negative for fever, chills, diaphoresis, activity change, appetite change, fatigue and unexpected weight change.  HENT: Negative for congestion, ear discharge, ear pain, hearing loss, postnasal drip, rhinorrhea, sore throat, tinnitus, trouble swallowing and voice change.   Eyes: Negative for pain, redness, itching and visual disturbance.  Respiratory: Negative for cough, choking, shortness of breath and wheezing.   Cardiovascular: Negative for chest pain, palpitations and leg swelling.  Gastrointestinal: Negative for nausea, abdominal pain, diarrhea, constipation and abdominal  distention.  Endocrine: Negative for cold intolerance, heat intolerance, polydipsia, polyphagia and polyuria.  Genitourinary: Negative for dysuria, urgency, frequency, hematuria, flank pain, vaginal discharge, difficulty urinating and pelvic pain.  Musculoskeletal: Positive for gait problem. Negative for myalgias, back pain, neck pain and neck stiffness.       Pain in the left foot. There is a reddish area on the dorsum. There is not any other deformity or bruising. Pain is significant enough when she stands and tries to walk that she is somewhat hobbled.  Skin: Negative for color change, pallor and rash.  Allergic/Immunologic: Negative.   Neurological: Negative for dizziness, tremors, seizures, syncope, weakness, numbness and headaches.       Alzheimer's disease.  Hematological: Negative for adenopathy. Does not bruise/bleed easily.  Psychiatric/Behavioral: Positive for confusion. Negative for suicidal ideas, hallucinations, behavioral problems, sleep disturbance, dysphoric mood and agitation. The patient is not nervous/anxious and is not hyperactive.     Filed Vitals:   02/23/14 1535  BP: 140/72  Pulse: 95  Temp: 99.3 F (37.4 C)  TempSrc: Oral  Resp: 10  Weight: 166 lb (75.297 kg)  SpO2: 94%   Body mass index is 30.35 kg/(m^2).  Physical Exam  Constitutional: She appears well-developed and well-nourished. No distress.  HENT:  Right Ear: External ear normal.  Left Ear: External ear normal.  Nose: Nose normal.  Mouth/Throat: Oropharynx is clear and moist. No oropharyngeal exudate.  Eyes: Conjunctivae and EOM are normal. Pupils are equal,  round, and reactive to light. No scleral icterus.  Neck: No JVD present. No tracheal deviation present. No thyromegaly present.  Cardiovascular: Normal rate, regular rhythm, normal heart sounds and intact distal pulses.  Exam reveals no gallop and no friction rub.   No murmur heard. Pulmonary/Chest: Effort normal. No respiratory distress. She  has no wheezes. She has no rales. She exhibits no tenderness.  Abdominal: She exhibits no distension and no mass. There is no tenderness.  Musculoskeletal: Normal range of motion. She exhibits tenderness (Dorsum of the left foot.). She exhibits no edema.  Lymphadenopathy:    She has no cervical adenopathy.  Neurological: She is alert. No cranial nerve deficit. Coordination normal.  Poor memory.  Skin: No rash noted. She is not diaphoretic. No erythema. No pallor.  Psychiatric: She has a normal mood and affect. Her behavior is normal. Judgment and thought content normal.     Labs reviewed: Admission on 02/20/2014, Discharged on 02/20/2014  Component Date Value Ref Range Status  . WBC 02/20/2014 12.4* 4.0 - 10.5 K/uL Final  . RBC 02/20/2014 3.99  3.87 - 5.11 MIL/uL Final  . Hemoglobin 02/20/2014 10.4* 12.0 - 15.0 g/dL Final  . HCT 02/20/2014 32.0* 36.0 - 46.0 % Final  . MCV 02/20/2014 80.2  78.0 - 100.0 fL Final  . MCH 02/20/2014 26.1  26.0 - 34.0 pg Final  . MCHC 02/20/2014 32.5  30.0 - 36.0 g/dL Final  . RDW 02/20/2014 13.4  11.5 - 15.5 % Final  . Platelets 02/20/2014 388  150 - 400 K/uL Final  . Neutrophils Relative % 02/20/2014 87* 43 - 77 % Final  . Neutro Abs 02/20/2014 10.9* 1.7 - 7.7 K/uL Final  . Lymphocytes Relative 02/20/2014 5* 12 - 46 % Final  . Lymphs Abs 02/20/2014 0.6* 0.7 - 4.0 K/uL Final  . Monocytes Relative 02/20/2014 7  3 - 12 % Final  . Monocytes Absolute 02/20/2014 0.9  0.1 - 1.0 K/uL Final  . Eosinophils Relative 02/20/2014 1  0 - 5 % Final  . Eosinophils Absolute 02/20/2014 0.1  0.0 - 0.7 K/uL Final  . Basophils Relative 02/20/2014 0  0 - 1 % Final  . Basophils Absolute 02/20/2014 0.0  0.0 - 0.1 K/uL Final  . Sodium 02/20/2014 140  137 - 147 mEq/L Final  . Potassium 02/20/2014 3.9  3.7 - 5.3 mEq/L Final  . Chloride 02/20/2014 102  96 - 112 mEq/L Final  . CO2 02/20/2014 21  19 - 32 mEq/L Final  . Glucose, Bld 02/20/2014 125* 70 - 99 mg/dL Final  . BUN  02/20/2014 23  6 - 23 mg/dL Final  . Creatinine, Ser 02/20/2014 1.63* 0.50 - 1.10 mg/dL Final  . Calcium 02/20/2014 9.2  8.4 - 10.5 mg/dL Final  . GFR calc non Af Amer 02/20/2014 26* >90 mL/min Final  . GFR calc Af Amer 02/20/2014 30* >90 mL/min Final   Comment: (NOTE) The eGFR has been calculated using the CKD EPI equation. This calculation has not been validated in all clinical situations. eGFR's persistently <90 mL/min signify possible Chronic Kidney Disease.   . Anion gap 02/20/2014 17* 5 - 15 Final   Dg Foot Complete Left  02/20/2014   CLINICAL DATA:  Left foot pain which started on Friday. No known injury.  EXAM: LEFT FOOT - COMPLETE 3+ VIEW  COMPARISON:  None.  FINDINGS: Mild degenerative changes and hallux valgus at the left first MTP joint. No acute bony abnormality. Specifically, no fracture, subluxation, or dislocation. Soft  tissues are intact.  IMPRESSION: No acute bony abnormality.   Electronically Signed   By: Rolm Baptise M.D.   On: 02/20/2014 13:09    Assessment/Plan 1. Pain in joint, ankle and foot, left Finish antibiotics - Diclofenac 18 MG CAPS; One daily to help pain in foot  Dispense: 6 capsule; Refill: 0 If still having discomfort by the time she finishes her samples, she should switch to Aleve 220 mg twice daily. If patient continues to have discomfort, consider doing uric acid level  2. Anemia, unspecified anemia type Last hemoglobin 10.4 with MCV 80. Etiology uncertain. Possibly related to chronic kidney disease.  3. Chronic kidney disease (CKD), stage III (moderate) Stable. Last creatinine 1.63.  4. Essential hypertension, benign Controlled  5. Hyperglycemia Last lab 125 mg percent. Not currently on any medications.

## 2014-02-23 NOTE — Patient Instructions (Signed)
When Zorvolex is finished, switch to Aleve 220 mg twice daily. It is available over the counter.

## 2014-02-24 DIAGNOSIS — D649 Anemia, unspecified: Secondary | ICD-10-CM | POA: Insufficient documentation

## 2014-03-08 ENCOUNTER — Ambulatory Visit: Payer: Medicare Other | Admitting: Nurse Practitioner

## 2014-06-01 ENCOUNTER — Other Ambulatory Visit: Payer: Self-pay | Admitting: Internal Medicine

## 2014-06-03 ENCOUNTER — Other Ambulatory Visit: Payer: Medicare Other

## 2014-06-03 DIAGNOSIS — R739 Hyperglycemia, unspecified: Secondary | ICD-10-CM

## 2014-06-03 DIAGNOSIS — N183 Chronic kidney disease, stage 3 unspecified: Secondary | ICD-10-CM

## 2014-06-04 LAB — HEMOGLOBIN A1C
Est. average glucose Bld gHb Est-mCnc: 111 mg/dL
Hgb A1c MFr Bld: 5.5 % (ref 4.8–5.6)

## 2014-06-04 LAB — COMPREHENSIVE METABOLIC PANEL
ALT: 12 IU/L (ref 0–32)
AST: 18 IU/L (ref 0–40)
Albumin/Globulin Ratio: 1.2 (ref 1.1–2.5)
Albumin: 3.7 g/dL (ref 3.2–4.6)
Alkaline Phosphatase: 73 IU/L (ref 39–117)
BUN/Creatinine Ratio: 13 (ref 11–26)
BUN: 15 mg/dL (ref 10–36)
Bilirubin Total: 0.4 mg/dL (ref 0.0–1.2)
CO2: 20 mmol/L (ref 18–29)
Calcium: 9.5 mg/dL (ref 8.7–10.3)
Chloride: 107 mmol/L (ref 97–108)
Creatinine, Ser: 1.19 mg/dL — ABNORMAL HIGH (ref 0.57–1.00)
GFR calc Af Amer: 45 mL/min/{1.73_m2} — ABNORMAL LOW (ref >59)
GFR calc non Af Amer: 39 mL/min/{1.73_m2} — ABNORMAL LOW (ref >59)
Globulin, Total: 3 g/dL (ref 1.5–4.5)
Glucose: 103 mg/dL — ABNORMAL HIGH (ref 65–99)
Potassium: 3.8 mmol/L (ref 3.5–5.2)
Sodium: 145 mmol/L — ABNORMAL HIGH (ref 134–144)
Total Protein: 6.7 g/dL (ref 6.0–8.5)

## 2014-06-04 LAB — CBC WITH DIFFERENTIAL
Basophils Absolute: 0 10*3/uL (ref 0.0–0.2)
Basos: 0 %
Eos: 3 %
Eosinophils Absolute: 0.3 10*3/uL (ref 0.0–0.4)
HCT: 37.3 % (ref 34.0–46.6)
Hemoglobin: 11.8 g/dL (ref 11.1–15.9)
Immature Grans (Abs): 0 10*3/uL (ref 0.0–0.1)
Immature Granulocytes: 0 %
Lymphocytes Absolute: 3.7 10*3/uL — ABNORMAL HIGH (ref 0.7–3.1)
Lymphs: 38 %
MCH: 25.4 pg — ABNORMAL LOW (ref 26.6–33.0)
MCHC: 31.6 g/dL (ref 31.5–35.7)
MCV: 80 fL (ref 79–97)
Monocytes Absolute: 0.5 10*3/uL (ref 0.1–0.9)
Monocytes: 5 %
Neutrophils Absolute: 5.2 10*3/uL (ref 1.4–7.0)
Neutrophils Relative %: 54 %
RBC: 4.64 x10E6/uL (ref 3.77–5.28)
RDW: 17.4 % — ABNORMAL HIGH (ref 12.3–15.4)
WBC: 9.7 10*3/uL (ref 3.4–10.8)

## 2014-06-04 LAB — LIPID PANEL
Chol/HDL Ratio: 2.7 ratio units (ref 0.0–4.4)
Cholesterol, Total: 129 mg/dL (ref 100–199)
HDL: 47 mg/dL (ref >39)
LDL Calculated: 54 mg/dL (ref 0–99)
Triglycerides: 142 mg/dL (ref 0–149)
VLDL Cholesterol Cal: 28 mg/dL (ref 5–40)

## 2014-06-07 ENCOUNTER — Encounter: Payer: Self-pay | Admitting: Internal Medicine

## 2014-06-07 ENCOUNTER — Ambulatory Visit (INDEPENDENT_AMBULATORY_CARE_PROVIDER_SITE_OTHER): Payer: Medicare Other | Admitting: Internal Medicine

## 2014-06-07 VITALS — BP 168/80 | HR 94 | Temp 97.3°F | Resp 12 | Ht 58.66 in | Wt 164.0 lb

## 2014-06-07 DIAGNOSIS — N183 Chronic kidney disease, stage 3 unspecified: Secondary | ICD-10-CM

## 2014-06-07 DIAGNOSIS — B0229 Other postherpetic nervous system involvement: Secondary | ICD-10-CM

## 2014-06-07 DIAGNOSIS — L602 Onychogryphosis: Secondary | ICD-10-CM

## 2014-06-07 DIAGNOSIS — F0280 Dementia in other diseases classified elsewhere without behavioral disturbance: Secondary | ICD-10-CM

## 2014-06-07 DIAGNOSIS — G309 Alzheimer's disease, unspecified: Secondary | ICD-10-CM | POA: Diagnosis not present

## 2014-06-07 DIAGNOSIS — Z0000 Encounter for general adult medical examination without abnormal findings: Secondary | ICD-10-CM | POA: Diagnosis not present

## 2014-06-07 DIAGNOSIS — R739 Hyperglycemia, unspecified: Secondary | ICD-10-CM

## 2014-06-07 DIAGNOSIS — L609 Nail disorder, unspecified: Secondary | ICD-10-CM

## 2014-06-07 DIAGNOSIS — I10 Essential (primary) hypertension: Secondary | ICD-10-CM

## 2014-06-07 DIAGNOSIS — B029 Zoster without complications: Secondary | ICD-10-CM

## 2014-06-07 MED ORDER — VALACYCLOVIR HCL 1 G PO TABS: 1000.0000 mg | ORAL_TABLET | Freq: Three times a day (TID) | ORAL | Status: DC

## 2014-06-07 MED ORDER — HYDROCHLOROTHIAZIDE 25 MG PO TABS: 25.0000 mg | ORAL_TABLET | Freq: Every day | ORAL | Status: DC

## 2014-06-07 NOTE — Patient Instructions (Addendum)
Take the valtrex three times a day for 10 days to treat the shingles she has on her left buttock.  Call if she is complaining of pain in the area.    Start hydrochlorothiazide along with the amlodipine she already takes for her blood pressure

## 2014-06-07 NOTE — Progress Notes (Signed)
Patient ID: Bridget Cisneros, female   DOB: October 15, 1916, 81 y.o.   MRN: [id number]   Location:  [facility]  No Known Allergies  Chief Complaint  Patient presents with  . Annual Exam    Yearly check-up, discuss labs (copy printed). Last EKG 09/12/12    HPI: Patient is a 81 y.o.  seen in the office today for her annual exam.    Says she will take her to get her nails trimmed.  They cannot afford to go to podiatry.    Only takes one bp med.   Recheck remained 168 systolic.  Sugar average normal.    Says she is ready to go home, but denies depression or sadness.  She also   Has rash on right upper buttock beneath depend.  Denies pain, but denies everything.  MMSE could not be done due to inability to follow the commands and answer the questions.  Unknown how much school she did.  Obsesses about God and also about looking for a man.    Review of Systems:  Review of Systems  Constitutional: Negative for fever.  HENT: Negative for congestion.   Respiratory: Negative for shortness of breath.   Cardiovascular: Negative for chest pain.  Gastrointestinal: Negative for abdominal pain.  Musculoskeletal: Negative for back pain, joint pain and falls.  Skin: Positive for rash. Negative for itching.       No pain at rash  Neurological: Negative for dizziness.  Psychiatric/Behavioral: Negative for depression.     Past Medical History  Diagnosis Date  . Alzheimer's disease   . Hypertension     History reviewed. No pertinent past surgical history.  Social History:   reports that she has never smoked. She does not have any smokeless tobacco history on file. She reports that she does not drink alcohol or use illicit drugs.  History reviewed. No pertinent family history.  Medications: Patient's Medications  New Prescriptions   No medications on file  Previous Medications   AMBULATORY NON FORMULARY MEDICATION    Depends Size Large Dx: 585.3 and  331.0   AMLODIPINE (NORVASC) 10 MG TABLET    TAKE ONE TABLET BY MOUTH ONCE DAILY FOR BLOOD PRESSURE  Modified Medications   No medications on file  Discontinued Medications   CEPHALEXIN (KEFLEX) 500 MG CAPSULE    Take 1 capsule (500 mg total) by mouth 3 (three) times daily.   DICLOFENAC 18 MG CAPS    One daily to help pain in foot   TRAMADOL (ULTRAM) 50 MG TABLET    Take 1 tablet (50 mg total) by mouth every 12 (twelve) hours as needed.     Physical Exam: Filed Vitals:   06/07/14 1404  BP: 194/82  Pulse: 94  Temp: 97.3 F (36.3 C)  TempSrc: Oral  Resp: 12  Height: 4' 10.66" (1.49 m)  Weight: 164 lb (74.39 kg)  SpO2: 97%  Physical Exam  Constitutional: She appears well-developed and well-nourished. No distress.  HENT:  Head: Normocephalic and atraumatic.  Right Ear: External ear normal.  Left Ear: External ear normal.  Nose: Nose normal.  Mouth/Throat: Oropharynx is clear and moist.  Wax in bilateral ears--caregiver says she won't let her put drops in and pt wants to dig around in her ears, but she doesn't let her  Eyes: Conjunctivae and EOM are normal. Pupils are equal, round, and reactive to light.  Neck: Neck supple. No JVD present. No thyromegaly present.  Cardiovascular: Normal rate,  regular rhythm, normal heart sounds and intact distal pulses.   Pulmonary/Chest: Effort normal and breath sounds normal. No respiratory distress. Right breast exhibits no inverted nipple, no mass, no nipple discharge, no skin change and no tenderness. Left breast exhibits no inverted nipple, no mass, no nipple discharge, no skin change and no tenderness.  Bilateral large, pendulous breasts  Abdominal: Soft. Bowel sounds are normal. She exhibits no distension and no mass. There is no tenderness.  Musculoskeletal:  Stooped posture, grabs right knee; caregiver says she won't use assistive device and says pt is short of breath when she stops  Lymphadenopathy:    She has no cervical adenopathy.    Neurological: She is alert.  Skin: Skin is warm.  Left upper buttock with small vesicular papular patch about 2 inches wide, 1 inch high;  None of the vesicles are currently opened   Psychiatric:  Talks continuously about God and finding herself a man    Labs reviewed: Basic Metabolic Panel:  Recent Labs  16/10/96 1145 06/03/14 0812  NA 140 145*  K 3.9 3.8  CL 102 107  CO2 21 20  GLUCOSE 125* 103*  BUN 23 15  CREATININE 1.63* 1.19*  CALCIUM 9.2 9.5   Liver Function Tests:  Recent Labs  06/03/14 0812  AST 18  ALT 12  ALKPHOS 73  BILITOT 0.4  PROT 6.7   No results for input(s): LIPASE, AMYLASE in the last 8760 hours. No results for input(s): AMMONIA in the last 8760 hours. CBC:  Recent Labs  02/20/14 1145 06/03/14 0812  WBC 12.4* 9.7  NEUTROABS 10.9* 5.2  HGB 10.4* 11.8  HCT 32.0* 37.3  MCV 80.2 80  PLT 388  --    Lipid Panel:  Recent Labs  06/03/14 0812  CHOL 129  HDL 47  LDLCALC 54  TRIG 142  CHOLHDL 2.7   Lab Results  Component Value Date   HGBA1C 5.5 06/03/2014     Assessment/Plan 1. Routine general medical examination at a health care facility -reviewed preventive care--family refuses all vaccines -she is no longer getting mammograms, bone densities, colon cancer screening due to advanced dementia -she is not depressed and has not fallen -she could not do the mmse b/c of little education and advanced dementia  2. Herpes zoster involving sacral dermatome - on left buttock - valACYclovir (VALTREX) 1000 MG tablet; Take 1 tablet (1,000 mg total) by mouth 3 (three) times daily.  Dispense: 30 tablet; Refill: 0 -her caregiver is to call if she develops pain in the area. -advised to avoid contact with pregnant or immunocompromised people  3. Alzheimer's disease -appears advanced, but also does not seem well educated -not on meds for this due to cost  4. Hyperglycemia -cont to watch sweets and starchy foods, last hba1c was normal  5.  Essential hypertension, benign -cont amlodipine  po daily and add hctz  daily -she drinks well at home per caregiver - hydrochlorothiazide (HYDRODIURIL) 25 MG tablet; Take 1 tablet (25 mg total) by mouth daily.  Dispense: 90 tablet; Refill: 1  6. Onychogryposis of toenail -recommended podiatry to trim her nails, but caregiver says they cannot afford it, but still takes her for pedicures  7. Chronic kidney disease (CKD), stage III (moderate) -avoid nephrotoxic agents like nsaids -encourage good hydration  Labs/tests ordered:  No orders of the defined types were placed in this encounter.  had labs before appt  Next appt:  3 mos with Shanda Bumps  Shanita Kanan L. Rudie Rikard, D.O. Geriatrics [employer]  [facility] [address], [place] [address] Cell Phone (Mon-Fri 8am-5pm):  [phone] On Call:  [phone] & follow prompts after 5pm & weekends Office Phone:  [phone] Office Fax:  [phone]

## 2014-08-13 ENCOUNTER — Telehealth: Payer: Self-pay | Admitting: *Deleted

## 2014-08-13 NOTE — Telephone Encounter (Signed)
Denise, Caregiver called and stated that patient went to the bathroom last night and got up and she could see a drop of blood in the toilet. Not sure if it is hemorrhoids or something else. Offered an appointment. Nurse is coming out Monday and she stated that she will have her check her and call us back Monday. Instructed her that if it worsens or any new symptoms to take to Urgent care. She agreed.

## 2014-08-17 ENCOUNTER — Other Ambulatory Visit: Payer: Self-pay

## 2014-08-17 DIAGNOSIS — R319 Hematuria, unspecified: Secondary | ICD-10-CM

## 2014-08-18 ENCOUNTER — Ambulatory Visit: Payer: Medicare Other | Admitting: Internal Medicine

## 2014-08-18 ENCOUNTER — Other Ambulatory Visit: Payer: Medicare Other

## 2014-08-18 DIAGNOSIS — R319 Hematuria, unspecified: Secondary | ICD-10-CM

## 2014-08-19 LAB — URINALYSIS
Bilirubin, UA: NEGATIVE
Glucose, UA: NEGATIVE
Ketones, UA: NEGATIVE
Nitrite, UA: NEGATIVE
PH UA: 5 (ref 5.0–7.5)
RBC, UA: NEGATIVE
Specific Gravity, UA: 1.015 (ref 1.005–1.030)
Urobilinogen, Ur: 0.2 mg/dL (ref 0.2–1.0)

## 2014-08-19 LAB — PLEASE NOTE

## 2014-08-20 LAB — URINE CULTURE: ORGANISM ID, BACTERIA: NO GROWTH

## 2014-09-09 ENCOUNTER — Ambulatory Visit: Payer: Medicare Other | Admitting: Nurse Practitioner

## 2014-09-20 ENCOUNTER — Ambulatory Visit: Payer: Self-pay | Admitting: Nurse Practitioner

## 2014-09-21 ENCOUNTER — Ambulatory Visit: Payer: Self-pay | Admitting: Nurse Practitioner

## 2014-09-21 ENCOUNTER — Ambulatory Visit: Payer: Medicare Other | Admitting: Nurse Practitioner

## 2014-11-29 ENCOUNTER — Other Ambulatory Visit: Payer: Self-pay | Admitting: Internal Medicine

## 2015-02-15 ENCOUNTER — Other Ambulatory Visit: Payer: Self-pay | Admitting: Nurse Practitioner

## 2015-03-25 ENCOUNTER — Telehealth: Payer: Self-pay | Admitting: *Deleted

## 2015-03-25 NOTE — Telephone Encounter (Signed)
Caregiver calling requesting a Rx for Hospital Bed and Commode due to weakness and can barely walk. Please Advise.

## 2015-03-28 ENCOUNTER — Inpatient Hospital Stay (HOSPITAL_COMMUNITY)
Admission: EM | Admit: 2015-03-28 | Discharge: 2015-03-31 | DRG: 392 | Disposition: A | Discharge status: Home Health | Payer: Medicare Other | Attending: Internal Medicine | Admitting: Internal Medicine

## 2015-03-28 ENCOUNTER — Encounter (HOSPITAL_COMMUNITY): Payer: Self-pay | Admitting: *Deleted

## 2015-03-28 ENCOUNTER — Emergency Department (HOSPITAL_COMMUNITY): Payer: Medicare Other

## 2015-03-28 DIAGNOSIS — R112 Nausea with vomiting, unspecified: Secondary | ICD-10-CM | POA: Diagnosis present

## 2015-03-28 DIAGNOSIS — G309 Alzheimer's disease, unspecified: Secondary | ICD-10-CM | POA: Diagnosis present

## 2015-03-28 DIAGNOSIS — R531 Weakness: Secondary | ICD-10-CM

## 2015-03-28 DIAGNOSIS — R627 Adult failure to thrive: Secondary | ICD-10-CM | POA: Diagnosis present

## 2015-03-28 DIAGNOSIS — I10 Essential (primary) hypertension: Secondary | ICD-10-CM | POA: Diagnosis present

## 2015-03-28 DIAGNOSIS — Z79899 Other long term (current) drug therapy: Secondary | ICD-10-CM

## 2015-03-28 DIAGNOSIS — I776 Arteritis, unspecified: Secondary | ICD-10-CM | POA: Diagnosis present

## 2015-03-28 DIAGNOSIS — D649 Anemia, unspecified: Secondary | ICD-10-CM | POA: Diagnosis present

## 2015-03-28 DIAGNOSIS — R5381 Other malaise: Secondary | ICD-10-CM | POA: Diagnosis present

## 2015-03-28 DIAGNOSIS — F0280 Dementia in other diseases classified elsewhere without behavioral disturbance: Secondary | ICD-10-CM | POA: Diagnosis present

## 2015-03-28 DIAGNOSIS — N179 Acute kidney failure, unspecified: Secondary | ICD-10-CM | POA: Diagnosis present

## 2015-03-28 DIAGNOSIS — Z66 Do not resuscitate: Secondary | ICD-10-CM | POA: Diagnosis present

## 2015-03-28 DIAGNOSIS — N183 Chronic kidney disease, stage 3 unspecified: Secondary | ICD-10-CM | POA: Diagnosis present

## 2015-03-28 DIAGNOSIS — E860 Dehydration: Secondary | ICD-10-CM | POA: Diagnosis present

## 2015-03-28 DIAGNOSIS — E440 Moderate protein-calorie malnutrition: Secondary | ICD-10-CM | POA: Diagnosis present

## 2015-03-28 DIAGNOSIS — N83209 Unspecified ovarian cyst, unspecified side: Secondary | ICD-10-CM | POA: Diagnosis present

## 2015-03-28 DIAGNOSIS — R4182 Altered mental status, unspecified: Secondary | ICD-10-CM

## 2015-03-28 DIAGNOSIS — I129 Hypertensive chronic kidney disease with stage 1 through stage 4 chronic kidney disease, or unspecified chronic kidney disease: Secondary | ICD-10-CM | POA: Diagnosis present

## 2015-03-28 DIAGNOSIS — R51 Headache: Secondary | ICD-10-CM

## 2015-03-28 DIAGNOSIS — A084 Viral intestinal infection, unspecified: Principal | ICD-10-CM | POA: Diagnosis present

## 2015-03-28 DIAGNOSIS — R339 Retention of urine, unspecified: Secondary | ICD-10-CM | POA: Diagnosis not present

## 2015-03-28 DIAGNOSIS — R519 Headache, unspecified: Secondary | ICD-10-CM | POA: Diagnosis present

## 2015-03-28 LAB — COMPREHENSIVE METABOLIC PANEL
ALBUMIN: 3.2 g/dL — AB (ref 3.5–5.0)
ALT: 53 U/L (ref 14–54)
AST: 57 U/L — AB (ref 15–41)
Alkaline Phosphatase: 109 U/L (ref 38–126)
Anion gap: 13 (ref 5–15)
BILIRUBIN TOTAL: 1 mg/dL (ref 0.3–1.2)
BUN: 56 mg/dL — ABNORMAL HIGH (ref 6–20)
CHLORIDE: 103 mmol/L (ref 101–111)
CO2: 21 mmol/L — ABNORMAL LOW (ref 22–32)
Calcium: 9.3 mg/dL (ref 8.9–10.3)
Creatinine, Ser: 2.38 mg/dL — ABNORMAL HIGH (ref 0.44–1.00)
GFR calc Af Amer: 19 mL/min — ABNORMAL LOW (ref >60)
GFR, EST NON AFRICAN AMERICAN: 16 mL/min — AB (ref >60)
Glucose, Bld: 133 mg/dL — ABNORMAL HIGH (ref 65–99)
POTASSIUM: 4 mmol/L (ref 3.5–5.1)
Sodium: 137 mmol/L (ref 135–145)
TOTAL PROTEIN: 7.7 g/dL (ref 6.5–8.1)

## 2015-03-28 LAB — CBC
HEMATOCRIT: 37.4 % (ref 36.0–46.0)
HEMOGLOBIN: 11.9 g/dL — AB (ref 12.0–15.0)
MCH: 26.8 pg (ref 26.0–34.0)
MCHC: 31.8 g/dL (ref 30.0–36.0)
MCV: 84.2 fL (ref 78.0–100.0)
Platelets: 486 10*3/uL — ABNORMAL HIGH (ref 150–400)
RBC: 4.44 MIL/uL (ref 3.87–5.11)
RDW: 13 % (ref 11.5–15.5)
WBC: 10.6 10*3/uL — AB (ref 4.0–10.5)

## 2015-03-28 LAB — URINALYSIS, ROUTINE W REFLEX MICROSCOPIC
Glucose, UA: NEGATIVE mg/dL
Hgb urine dipstick: NEGATIVE
KETONES UR: NEGATIVE mg/dL
LEUKOCYTES UA: NEGATIVE
NITRITE: NEGATIVE
PH: 5 (ref 5.0–8.0)
Protein, ur: 30 mg/dL — AB
SPECIFIC GRAVITY, URINE: 1.016 (ref 1.005–1.030)

## 2015-03-28 LAB — URINE MICROSCOPIC-ADD ON: RBC / HPF: NONE SEEN RBC/hpf (ref 0–5)

## 2015-03-28 LAB — LIPASE, BLOOD: LIPASE: 30 U/L (ref 11–51)

## 2015-03-28 MED ORDER — SODIUM CHLORIDE 0.9 % IV BOLUS (SEPSIS)
1000.0000 mL | Freq: Once | INTRAVENOUS | Status: AC
  Administered 2015-03-29: 1000 mL via INTRAVENOUS

## 2015-03-28 NOTE — ED Notes (Signed)
Per EMS: pt coming from home with c/o n/v x 1 around 2000. Caregiver at bedside reports increased weakness, decrease in appetite for the past three months. Pt denies pain, abdomen soft non tender. Pt alert to pt's norm, respirations equal and unlabored, skin warm and dry

## 2015-03-28 NOTE — ED Provider Notes (Signed)
CSN: [id number]     Arrival date & time 03/28/15  2143 History  By signing my name below, I, Bridget Cisneros, attest that this documentation has been prepared under the direction and in the presence of Tomasita CrumbleAdeleke Daryn Pisani, MD. Electronically Signed: Budd PalmerVanessa Cisneros, ED Scribe. 03/29/2015. 12:15 AM.    Chief Complaint  Patient presents with  . Nausea  . Emesis   The history is provided by the patient and a relative.  HPI Comments: Level 5 Caveat (Age) Bridget Cisneros is a 81 y.o. female with a PMHx of Alzheimer's disease and HTN brought in by ambulance, who presents to the Emergency Department complaining of nausea and emesis onset this evening. Per granddaughter, pt has been weak with loss of appetite for the past 3-4 months. She notes she takes care of pt and her daughter at her home. She states pt has been sleeping all day and will not get up. She notes pt c/o left sided HA this evening, and asked to go to the bathroom, at which time pt vomited profusely. She states that pt then became pale and has been holding her right eye less open. She notes pt has a DNR and a PMHx of heart disease.  She states pt "doesn't see out of the eye" anymore and is no longer alert. She states she has not tried giving pt booster shakes, because they tend to cause diarrhea. She denies pt having diarrhea and frequent urination.    Past Medical History  Diagnosis Date  . Alzheimer's disease   . Hypertension    History reviewed. No pertinent past surgical history. History reviewed. No pertinent family history. Social History  Substance Use Topics  . Smoking status: Never Smoker   . Smokeless tobacco: None  . Alcohol Use: No   OB History    Gravida Para Term Preterm AB TAB SAB Ectopic Multiple Living            2     Review of Systems  Unable to perform ROS: Age    Allergies  Review of patient's allergies indicates no known allergies.  Home Medications   Prior to Admission medications   Medication Sig Start  Date End Date Taking? Authorizing Provider  amLODipine (NORVASC) 10 MG tablet TAKE ONE TABLET BY MOUTH ONCE DAILY FOR BLOOD PRESSURE 02/15/15  Yes Sharon SellerJessica K Eubanks, NP  AMBULATORY NON FORMULARY MEDICATION Depends Size Large Dx: 585.3 and 331.0 12/22/13   Oneal GroutMahima Pandey, MD  hydrochlorothiazide (HYDRODIURIL) 25 MG tablet TAKE ONE TABLET BY MOUTH ONCE DAILY Patient not taking: Reported on 03/28/2015 02/15/15   Sharon SellerJessica K Eubanks, NP  valACYclovir (VALTREX) 1000 MG tablet Take 1 tablet (1,000 mg total) by mouth 3 (three) times daily. 06/07/14   Tiffany L Reed, DO   BP 114/60 mmHg  Pulse 67  Temp(Src) 97.9 F (36.6 C) (Oral)  Resp 15  SpO2 99% Physical Exam  Constitutional: She appears well-developed and well-nourished. No distress.  HENT:  Head: Normocephalic and atraumatic.  Nose: Nose normal.  Mouth/Throat: No oropharyngeal exudate.  Eyes: Conjunctivae and EOM are normal. Pupils are equal, round, and reactive to light. No scleral icterus.  Neck: Normal range of motion. Neck supple. No JVD present. No tracheal deviation present. No thyromegaly present.  Cardiovascular: Normal rate and normal heart sounds.  Exam reveals no gallop and no friction rub.   No murmur heard. Irregular rhythm  Pulmonary/Chest: Effort normal and breath sounds normal. No respiratory distress. She has no wheezes. She exhibits no tenderness.  Abdominal: Soft. Bowel sounds are normal. She exhibits no distension and no mass. There is no tenderness. There is no rebound and no guarding.  Musculoskeletal: Normal range of motion. She exhibits no edema or tenderness.  Lymphadenopathy:    She has no cervical adenopathy.  Neurological: She is alert. No cranial nerve deficit. She exhibits normal muscle tone.  Skin: Skin is warm and dry. No rash noted. No erythema. No pallor.  Nursing note and vitals reviewed.   ED Course  Procedures  DIAGNOSTIC STUDIES: Oxygen Saturation is 98% on RA, normal by my interpretation.     COORDINATION OF CARE: 11:24 PM - Discussed lab results of dehydration. Discussed plans to order diagnostic imaging and IV fluids. Pt advised of plan for treatment and pt agrees.  Labs Review Labs Reviewed  COMPREHENSIVE METABOLIC PANEL - Abnormal; Notable for the following:    CO2 21 (*)    Glucose, Bld 133 (*)    BUN 56 (*)    Creatinine, Ser 2.38 (*)    Albumin 3.2 (*)    AST 57 (*)    GFR calc non Af Amer 16 (*)    GFR calc Af Amer 19 (*)    All other components within normal limits  CBC - Abnormal; Notable for the following:    WBC 10.6 (*)    Hemoglobin 11.9 (*)    Platelets 486 (*)    All other components within normal limits  URINALYSIS, ROUTINE W REFLEX MICROSCOPIC (NOT AT [facility]) - Abnormal; Notable for the following:    Color, Urine AMBER (*)    APPearance CLOUDY (*)    Bilirubin Urine SMALL (*)    Protein, ur 30 (*)    All other components within normal limits  URINE MICROSCOPIC-ADD ON - Abnormal; Notable for the following:    Squamous Epithelial / LPF 0-5 (*)    Bacteria, UA FEW (*)    Casts HYALINE CASTS (*)    Crystals CA OXALATE CRYSTALS (*)    All other components within normal limits  URINE CULTURE  LIPASE, BLOOD    Imaging Review Ct Abdomen Pelvis Wo Contrast  03/29/2015  CLINICAL DATA:  81 year old female with nausea vomiting. EXAM: CT ABDOMEN AND PELVIS WITHOUT CONTRAST TECHNIQUE: Multidetector CT imaging of the abdomen and pelvis was performed following the standard protocol without IV contrast. COMPARISON:  CT dated 09/12/2012 FINDINGS: Evaluation of this exam is limited in the absence of intravenous contrast. Evaluation is also limited due to streak artifact caused by patient's arms. Bibasilar linear atelectasis/scarring. There is slight hypo attenuation of the cardiac blood pool compatible with a degree of anemia. There is coronary vascular calcification. No intra-abdominal free air or free fluid. Stable subcentimeter right hepatic hypodense lesion is  not characterized. The liver is otherwise unremarkable. The gallbladder, pancreas, spleen, and the adrenal glands appear unremarkable. Stable appearing 3 cm left renal upper pole hypodense lesion which is not characterized on this noncontrast study but likely represents a cyst. There is moderate bilateral renal atrophy. There is no hydronephrosis or nephrolithiasis on either side. The visualized ureters appear unremarkable. There is apparent diffuse thickening of the bladder wall which may be partly related to underdistention. Cystitis is not excluded. Correlation with urinalysis recommended. The uterus appears grossly unremarkable. There is a cystic lesion in the left hemipelvis measuring approximately 10.4 x 7.5 cm (previously 6.7 x 5.8 cm) likely arising from the left ovary. There scattered sigmoid diverticula without active inflammation. There is no evidence of bowel obstruction or  inflammation. Normal appendix. There is aortoiliac atherosclerotic disease. No portal venous gas identified. There is no adenopathy. The abdominal wall soft tissues appear unremarkable. There is osteopenia with degenerative changes of the spine. There multilevel disc desiccation with vacuum phenomena. No acute fracture. IMPRESSION: No hydronephrosis or nephrolithiasis. Correlation with urinalysis recommended to exclude UTI. Diverticulosis. No evidence of bowel obstruction or inflammation. Normal appendix. Interval increase in the size of the left ovarian cystic lesion. Ultrasound may provide better evaluation Electronically Signed   By: Elgie Collard M.D.   On: 03/29/2015 00:22   Dg Chest 2 View  03/28/2015  CLINICAL DATA:  81 year old female with nausea and vomiting EXAM: CHEST  2 VIEW COMPARISON:  Chest radiograph dated 09/12/2012 FINDINGS: The patient is kyphotic. The lung apices are not visualized obscured by patient's mandible. Two views of the chest do not demonstrate focal consolidation. No pleural effusion or  pneumothorax. Stable cardiac silhouette. The osseous structures are grossly unremarkable. IMPRESSION: No active cardiopulmonary disease. Electronically Signed   By: Elgie Collard M.D.   On: 03/28/2015 23:37   Ct Head Wo Contrast  03/29/2015  CLINICAL DATA:  81 year old female with altered mental status, nausea and vomiting. Patient complains of left-sided headache. EXAM: CT HEAD WITHOUT CONTRAST TECHNIQUE: Contiguous axial images were obtained from the base of the skull through the vertex without intravenous contrast. COMPARISON:  None. FINDINGS: The ventricles are dilated and the sulci are prominent compatible with age-related atrophy. Periventricular and deep white matter hypodensities represent chronic microvascular ischemic changes. There is no intracranial hemorrhage. No mass effect or midline shift identified. There is mucoperiosteal thickening and partial opacification of the left maxillary sinus, sphenoid sinuses, and multiple ethmoid air cells. The mastoid air cells are well aerated. The calvarium is intact. IMPRESSION: No acute intracranial hemorrhage. Age-related atrophy and chronic microvascular ischemic disease. If symptoms persist and there are no contraindications, MRI may provide better evaluation if clinically indicated. Electronically Signed   By: Elgie Collard M.D.   On: 03/29/2015 00:13   I have personally reviewed and evaluated these images and lab results as part of my medical decision-making.   EKG Interpretation   Date/Time:  Monday March 28 2015 22:59:15 EST Ventricular Rate:  69 PR Interval:  209 QRS Duration: 147 QT Interval:  437 QTC Calculation: 468 R Axis:   32 Text Interpretation:  Sinus rhythm Supraventricular bigeminy Left bundle  branch block Nonspecific ST and T wave abnormality t wave inversion  lateral leads No acute changes Confirmed by Rhunette Croft, MD, Janey Genta [phone]) on  03/28/2015 11:06:51 PM      MDM   Final diagnoses:  None   Patient  presents to the ED for AMS, weakness and dehydration.  She has AKI with Cr of 2.4.  ED work up, including CT head and abdomen, is negative.  Patient given IVF.  Will page [facility] for further workup of weakness.  Dr. Clyde Lundborg accepts patient for admission to tele.   I personally performed the services described in this documentation, which was scribed in my presence. The recorded information has been reviewed and is accurate.     Tomasita Crumble, MD 03/29/15 [phone]

## 2015-03-29 ENCOUNTER — Inpatient Hospital Stay (HOSPITAL_COMMUNITY): Payer: Medicare Other

## 2015-03-29 ENCOUNTER — Encounter (HOSPITAL_COMMUNITY): Payer: Self-pay | Admitting: *Deleted

## 2015-03-29 DIAGNOSIS — G43A0 Cyclical vomiting, not intractable: Secondary | ICD-10-CM | POA: Diagnosis not present

## 2015-03-29 DIAGNOSIS — R5381 Other malaise: Secondary | ICD-10-CM | POA: Diagnosis present

## 2015-03-29 DIAGNOSIS — R51 Headache: Secondary | ICD-10-CM

## 2015-03-29 DIAGNOSIS — Z66 Do not resuscitate: Secondary | ICD-10-CM | POA: Diagnosis present

## 2015-03-29 DIAGNOSIS — R112 Nausea with vomiting, unspecified: Secondary | ICD-10-CM | POA: Diagnosis present

## 2015-03-29 DIAGNOSIS — R519 Headache, unspecified: Secondary | ICD-10-CM | POA: Diagnosis present

## 2015-03-29 DIAGNOSIS — E440 Moderate protein-calorie malnutrition: Secondary | ICD-10-CM | POA: Diagnosis present

## 2015-03-29 DIAGNOSIS — G309 Alzheimer's disease, unspecified: Secondary | ICD-10-CM | POA: Diagnosis not present

## 2015-03-29 DIAGNOSIS — Z79899 Other long term (current) drug therapy: Secondary | ICD-10-CM | POA: Diagnosis not present

## 2015-03-29 DIAGNOSIS — I129 Hypertensive chronic kidney disease with stage 1 through stage 4 chronic kidney disease, or unspecified chronic kidney disease: Secondary | ICD-10-CM | POA: Diagnosis present

## 2015-03-29 DIAGNOSIS — I776 Arteritis, unspecified: Secondary | ICD-10-CM | POA: Diagnosis present

## 2015-03-29 DIAGNOSIS — N179 Acute kidney failure, unspecified: Secondary | ICD-10-CM | POA: Diagnosis not present

## 2015-03-29 DIAGNOSIS — R531 Weakness: Secondary | ICD-10-CM | POA: Diagnosis not present

## 2015-03-29 DIAGNOSIS — I10 Essential (primary) hypertension: Secondary | ICD-10-CM | POA: Diagnosis not present

## 2015-03-29 DIAGNOSIS — G9340 Encephalopathy, unspecified: Secondary | ICD-10-CM | POA: Insufficient documentation

## 2015-03-29 DIAGNOSIS — N83209 Unspecified ovarian cyst, unspecified side: Secondary | ICD-10-CM | POA: Diagnosis present

## 2015-03-29 DIAGNOSIS — N183 Chronic kidney disease, stage 3 (moderate): Secondary | ICD-10-CM | POA: Diagnosis present

## 2015-03-29 DIAGNOSIS — R627 Adult failure to thrive: Secondary | ICD-10-CM | POA: Diagnosis present

## 2015-03-29 DIAGNOSIS — D649 Anemia, unspecified: Secondary | ICD-10-CM | POA: Diagnosis present

## 2015-03-29 DIAGNOSIS — E860 Dehydration: Secondary | ICD-10-CM | POA: Diagnosis present

## 2015-03-29 DIAGNOSIS — F0280 Dementia in other diseases classified elsewhere without behavioral disturbance: Secondary | ICD-10-CM | POA: Diagnosis present

## 2015-03-29 DIAGNOSIS — R339 Retention of urine, unspecified: Secondary | ICD-10-CM | POA: Diagnosis not present

## 2015-03-29 DIAGNOSIS — A084 Viral intestinal infection, unspecified: Secondary | ICD-10-CM | POA: Diagnosis present

## 2015-03-29 LAB — CBC
HCT: 34.6 % — ABNORMAL LOW (ref 36.0–46.0)
Hemoglobin: 11.5 g/dL — ABNORMAL LOW (ref 12.0–15.0)
MCH: 29 pg (ref 26.0–34.0)
MCHC: 33.2 g/dL (ref 30.0–36.0)
MCV: 87.2 fL (ref 78.0–100.0)
PLATELETS: 436 10*3/uL — AB (ref 150–400)
RBC: 3.97 MIL/uL (ref 3.87–5.11)
RDW: 13.3 % (ref 11.5–15.5)
WBC: 8 10*3/uL (ref 4.0–10.5)

## 2015-03-29 LAB — COMPREHENSIVE METABOLIC PANEL
ALT: 63 U/L — AB (ref 14–54)
AST: 71 U/L — ABNORMAL HIGH (ref 15–41)
Albumin: 2.9 g/dL — ABNORMAL LOW (ref 3.5–5.0)
Alkaline Phosphatase: 95 U/L (ref 38–126)
Anion gap: 11 (ref 5–15)
BUN: 52 mg/dL — ABNORMAL HIGH (ref 6–20)
CALCIUM: 8.7 mg/dL — AB (ref 8.9–10.3)
CHLORIDE: 108 mmol/L (ref 101–111)
CO2: 20 mmol/L — ABNORMAL LOW (ref 22–32)
CREATININE: 1.98 mg/dL — AB (ref 0.44–1.00)
GFR, EST AFRICAN AMERICAN: 23 mL/min — AB (ref >60)
GFR, EST NON AFRICAN AMERICAN: 20 mL/min — AB (ref >60)
Glucose, Bld: 112 mg/dL — ABNORMAL HIGH (ref 65–99)
Potassium: 4.7 mmol/L (ref 3.5–5.1)
Sodium: 139 mmol/L (ref 135–145)
Total Bilirubin: 1 mg/dL (ref 0.3–1.2)
Total Protein: 7.1 g/dL (ref 6.5–8.1)

## 2015-03-29 LAB — SEDIMENTATION RATE: SED RATE: 110 mm/h — AB (ref 0–22)

## 2015-03-29 LAB — CREATININE, URINE, RANDOM: CREATININE, URINE: 82.63 mg/dL

## 2015-03-29 LAB — TROPONIN I
Troponin I: 0.05 ng/mL — ABNORMAL HIGH (ref <0.031)
Troponin I: <0.03 ng/mL (ref <0.031)

## 2015-03-29 LAB — PROTIME-INR
INR: 1.28 (ref 0.00–1.49)
Prothrombin Time: 16.2 seconds — ABNORMAL HIGH (ref 11.6–15.2)

## 2015-03-29 LAB — C-REACTIVE PROTEIN: CRP: 10.7 mg/dL — AB (ref <1.0)

## 2015-03-29 MED ORDER — HEPARIN SODIUM (PORCINE) 5000 UNIT/ML IJ SOLN
5000.0000 [IU] | Freq: Three times a day (TID) | INTRAMUSCULAR | Status: DC
  Administered 2015-03-29 – 2015-03-31 (×8): 5000 [IU] via SUBCUTANEOUS
  Filled 2015-03-29 (×7): qty 1

## 2015-03-29 MED ORDER — DEXTROSE 5 % IV SOLN: 1.0000 g | Freq: Once | INTRAVENOUS | Status: DC

## 2015-03-29 MED ORDER — HYDROXYZINE HCL 50 MG/ML IM SOLN
25.0000 mg | Freq: Four times a day (QID) | INTRAMUSCULAR | Status: DC | PRN
Start: 2015-03-29 — End: 2015-03-31
  Filled 2015-03-29: qty 0.5

## 2015-03-29 MED ORDER — METHYLPREDNISOLONE SODIUM SUCC 125 MG IJ SOLR
60.0000 mg | Freq: Once | INTRAMUSCULAR | Status: AC
  Administered 2015-03-29: 60 mg via INTRAVENOUS
  Filled 2015-03-29: qty 2

## 2015-03-29 MED ORDER — SODIUM CHLORIDE 0.9 % IJ SOLN
3.0000 mL | Freq: Two times a day (BID) | INTRAMUSCULAR | Status: DC
  Administered 2015-03-29 – 2015-03-31 (×5): 3 mL via INTRAVENOUS

## 2015-03-29 MED ORDER — PREDNISONE 20 MG PO TABS: 40.0000 mg | ORAL_TABLET | Freq: Every day | ORAL | Status: DC

## 2015-03-29 MED ORDER — SODIUM CHLORIDE 0.9 % IV SOLN
INTRAVENOUS | Status: DC
  Administered 2015-03-29 – 2015-03-31 (×4): via INTRAVENOUS

## 2015-03-29 MED ORDER — AMLODIPINE BESYLATE 2.5 MG PO TABS
2.5000 mg | ORAL_TABLET | Freq: Every day | ORAL | Status: DC
  Administered 2015-03-29 – 2015-03-31 (×3): 2.5 mg via ORAL
  Filled 2015-03-29 (×3): qty 1

## 2015-03-29 NOTE — Progress Notes (Signed)
Patient arrived from the ER at 0325, alert and confused. Patient oriented to room and equipment. VSS. MAEW. All safety measures in place. Will monitor closely overnight.

## 2015-03-29 NOTE — Progress Notes (Signed)
[facility] PROGRESS NOTE  Bridget Cisneros ZOX:[id number] DOB: March 11, 1917 DOA: 03/28/2015 PCP: Lauree Chandler, NP  brief narrative 81 year old female with history of hypertension, Alzheimer's disease with severe dementia, chronic kidney disease stage III presented with generalized weakness associated with nausea, vomiting and headache. History was admitted on admission from the family. As per her granddaughter she was feeling weak for the past 4 months with poor appetite and very poor by mouth intake. Patient? Complaining of headache in her left temporal area without any visual changes. On the day of admission she had 3 episodes of nausea with vomiting. she was afebrile An physical exam her neurological exam was unremarkable. In the ED her white count was 10.6 UA was unremarkable, transaminitis with acute on chronic kidney injury. Chest x-ray and head CT were unremarkable. CT of the abdomen and pelvis was negative for acute intra-abdominal abnormality. Patient admitted for further management. Her ESR and CRP were elevated (110 and 10.7 respectively) and patient was started on oral prednisone with concern for temporal arteritis.  Assessment/Plan: Generalized weakness with failure to thrive Appears to be progressive deconditioning. Patient also dehydrated with poor by mouth intake which is further worsened with nausea and vomiting. No focal neurological deficit on exam. Continue IV fluids. PT/OT evaluation.  Nausea and vomiting No clear etiology. May have viral gastritis/gastroenteritis.  continue IV fluids and when necessary hydroxyzine for nausea. Nutrition consult.  Headache Reportedly had a left temporal headache program daughters. Her ESR and CRP were elevated. She was started on empiric prednisone. On my exam this afternoon patient denies any symptoms, is not in any distress or have any tenderness or palpable lesion in her temples. I would consider further workup including a  temporal artery biopsy and empiric steroid at this 81 year old demented lady. I have discussed this with neurology Dr. Nicole Kindred who recommends the same.  Acute on chronic kidney disease stage III Baseline creatinine of 1.6. Possibly prerenal secondary to dehydration. Continue IV fluids. Discontinued diuretics. CT abdomen and pelvis negative.   Severe dementia  Essential hypertension Continue amlodipine  DVT prophylaxis: Subcutaneous heparin  CODE STATUS: DO NOT RESUSCITATE  Diet: Regular  Family Communication: none at bedside. Will update her granddaughters  Disposition Plan: PT evaluation. May need skilled nursing facility.   Consultants: None  Procedures:  Head CT  CT abdomen and pelvis    Antibiotics:  none  HPI/Subjective: Seen and examined . Admission H&P reviewed. Patient unable to provide any history but appears comfortable.   Objective: Filed Vitals:   03/29/15 1027 03/29/15 1438  BP: 148/49 132/51  Pulse: 68 69  Temp: 97.8 F (36.6 C) 97.9 F (36.6 C)  Resp: 17 16    Intake/Output Summary (Last 24 hours) at 03/29/15 1447 Last data filed at 03/29/15 8119  Gross per 24 hour  Intake   1000 ml  Output     10 ml  Net    990 ml   There were no vitals filed for this visit.  Exam:   General: Elderly thin built female not in distress, confused  HEENT: No pallor, moist mucosa. No temporal lesion , palpable cord or swelling   chest: Clear bilaterally  CVS: Normal S1 and S2, no murmurs  GI: Soft, nondistended, nontender  Musculoskeletal: Warm, no edema  CNS: Awake but not oriented     Data Reviewed: Basic Metabolic Panel:  Recent Labs Lab 03/28/15 2219 03/29/15 0430  NA 137 139  K 4.0 4.7  CL 103 108  CO2 21*  20*  GLUCOSE 133* 112*  BUN 56* 52*  CREATININE 2.38* 1.98*  CALCIUM 9.3 8.7*   Liver Function Tests:  Recent Labs Lab 03/28/15 2219 03/29/15 0430  AST 57* 71*  ALT 53 63*  ALKPHOS 109 95  BILITOT 1.0 1.0  PROT  7.7 7.1  ALBUMIN 3.2* 2.9*    Recent Labs Lab 03/28/15 2219  LIPASE 30   No results for input(s): AMMONIA in the last 168 hours. CBC:  Recent Labs Lab 03/28/15 2219 03/29/15 0430  WBC 10.6* 8.0  HGB 11.9* 11.5*  HCT 37.4 34.6*  MCV 84.2 87.2  PLT 486* 436*   Cardiac Enzymes:  Recent Labs Lab 03/29/15 0430 03/29/15 1110  TROPONINI 0.05* <0.03   BNP (last 3 results) No results for input(s): BNP in the last 8760 hours.  ProBNP (last 3 results) No results for input(s): PROBNP in the last 8760 hours.  CBG: No results for input(s): GLUCAP in the last 168 hours.  No results found for this or any previous visit (from the past 240 hour(s)).   Studies: Ct Abdomen Pelvis Wo Contrast  03/29/2015  CLINICAL DATA:  81 year old female with nausea vomiting. EXAM: CT ABDOMEN AND PELVIS WITHOUT CONTRAST TECHNIQUE: Multidetector CT imaging of the abdomen and pelvis was performed following the standard protocol without IV contrast. COMPARISON:  CT dated 09/12/2012 FINDINGS: Evaluation of this exam is limited in the absence of intravenous contrast. Evaluation is also limited due to streak artifact caused by patient's arms. Bibasilar linear atelectasis/scarring. There is slight hypo attenuation of the cardiac blood pool compatible with a degree of anemia. There is coronary vascular calcification. No intra-abdominal free air or free fluid. Stable subcentimeter right hepatic hypodense lesion is not characterized. The liver is otherwise unremarkable. The gallbladder, pancreas, spleen, and the adrenal glands appear unremarkable. Stable appearing 3 cm left renal upper pole hypodense lesion which is not characterized on this noncontrast study but likely represents a cyst. There is moderate bilateral renal atrophy. There is no hydronephrosis or nephrolithiasis on either side. The visualized ureters appear unremarkable. There is apparent diffuse thickening of the bladder wall which may be partly related  to underdistention. Cystitis is not excluded. Correlation with urinalysis recommended. The uterus appears grossly unremarkable. There is a cystic lesion in the left hemipelvis measuring approximately 10.4 x 7.5 cm (previously 6.7 x 5.8 cm) likely arising from the left ovary. There scattered sigmoid diverticula without active inflammation. There is no evidence of bowel obstruction or inflammation. Normal appendix. There is aortoiliac atherosclerotic disease. No portal venous gas identified. There is no adenopathy. The abdominal wall soft tissues appear unremarkable. There is osteopenia with degenerative changes of the spine. There multilevel disc desiccation with vacuum phenomena. No acute fracture. IMPRESSION: No hydronephrosis or nephrolithiasis. Correlation with urinalysis recommended to exclude UTI. Diverticulosis. No evidence of bowel obstruction or inflammation. Normal appendix. Interval increase in the size of the left ovarian cystic lesion. Ultrasound may provide better evaluation Electronically Signed   By: Anner Crete M.D.   On: 03/29/2015 00:22   Dg Chest 2 View  03/28/2015  CLINICAL DATA:  81 year old female with nausea and vomiting EXAM: CHEST  2 VIEW COMPARISON:  Chest radiograph dated 09/12/2012 FINDINGS: The patient is kyphotic. The lung apices are not visualized obscured by patient's mandible. Two views of the chest do not demonstrate focal consolidation. No pleural effusion or pneumothorax. Stable cardiac silhouette. The osseous structures are grossly unremarkable. IMPRESSION: No active cardiopulmonary disease. Electronically Signed   By: Anner Crete  M.D.   On: 03/28/2015 23:37   Ct Head Wo Contrast  03/29/2015  CLINICAL DATA:  81 year old female with altered mental status, nausea and vomiting. Patient complains of left-sided headache. EXAM: CT HEAD WITHOUT CONTRAST TECHNIQUE: Contiguous axial images were obtained from the base of the skull through the vertex without intravenous  contrast. COMPARISON:  None. FINDINGS: The ventricles are dilated and the sulci are prominent compatible with age-related atrophy. Periventricular and deep white matter hypodensities represent chronic microvascular ischemic changes. There is no intracranial hemorrhage. No mass effect or midline shift identified. There is mucoperiosteal thickening and partial opacification of the left maxillary sinus, sphenoid sinuses, and multiple ethmoid air cells. The mastoid air cells are well aerated. The calvarium is intact. IMPRESSION: No acute intracranial hemorrhage. Age-related atrophy and chronic microvascular ischemic disease. If symptoms persist and there are no contraindications, MRI may provide better evaluation if clinically indicated. Electronically Signed   By: Anner Crete M.D.   On: 03/29/2015 00:13   US Pelvis Complete  03/29/2015  CLINICAL DATA:  Left ovarian cyst. EXAM: TRANSABDOMINAL ULTRASOUND OF PELVIS TECHNIQUE: Transabdominal ultrasound examination of the pelvis was performed including evaluation of the uterus, ovaries, adnexal regions, and pelvic cul-de-sac. COMPARISON:  CT 03/28/2015, 09/12/2012. FINDINGS: Uterus Measurements: 9.6 x 2.3 x 4.4 cm. No fibroids or other mass visualized. Endometrium Thickness: 17 mm.  Heterogeneous echotexture. Right ovary Not visualized. Left ovary Measurements: 10.9 x 5.2 x 10.2 cm. 10.2 x 4.4 x 9.0 cm cyst. Minimal thickening of the cyst wall cannot be excluded. Other findings: No abnormal free fluid. Limited exam due to patient's clinical condition. IMPRESSION: 1. Thickening of the endometrium is 17 mm. Endometrium is heterogeneous in echotexture . Endometrial thickness is considered abnormal for an asymptomatic post-menopausal female. Endometrial sampling should be considered to exclude carcinoma. 2. 10.2 x 4.9 x 9.0 cm left ovarian cyst. Minimal thickening of the cyst wall cannot be excluded. Since these may be difficult to assess completely with [place], further  evaluation of simple-appearing cysts >7 cm with MRI or surgical evaluation is recommended according to the Society of Radiologists in Ultrasound 2010 Consensus Conference Statement (D Clovis Riley et al. Management of Asymptomatic Ovarian and other Adnexal Cysts Imaged at [place]: Society of Radiologists in [place] Statement 2010. Radiology 256 (Sept 2010): 320-233.). Electronically Signed   By: Marcello Moores  Register   On: 03/29/2015 08:05    Scheduled Meds: . amLODipine  2.5 mg Oral Daily  . heparin  5,000 Units Subcutaneous 3 times per day  . sodium chloride  3 mL Intravenous Q12H   Continuous Infusions: . sodium chloride 75 mL/hr at 03/29/15 0402      Time spent: 20 minutes    Bridget Cisneros  [facility] Pager [phone]. If 7PM-7AM, please contact night-coverage at [email or web address], password [facility] 03/29/2015, 2:47 PM  LOS: 0 days

## 2015-03-29 NOTE — ED Notes (Signed)
Bed status changed after calling report. Waiting for new bed assignment.

## 2015-03-29 NOTE — Telephone Encounter (Signed)
Caregiver called and stated that patient is in hospital now and caregiver is wanting an order for Hospital Bed and Commode. Caregiver is going to discuss this with the Hospital and see if they can write for one. She will keep us informed.

## 2015-03-29 NOTE — Evaluation (Signed)
Physical Therapy Evaluation Patient Details Name: Bridget Cisneros MRN: [id number] DOB: 08/06/1916 Today's Date: 03/29/2015   History of Present Illness  Adm 12/26 with N/V/abd pain, CT abd negative  PMHx- dementia, FTT, CKD, HTN  Clinical Impression  Pt admitted with above diagnosis. Pt currently with functional limitations due to the deficits listed below (see PT Problem List). Per family, she has had a functional decline. Family committed to caring for pt at home. Pt will benefit from skilled PT to increase their independence and safety with mobility to allow discharge to the venue listed below.       Follow Up Recommendations Home health PT;Supervision/Assistance - 24 hour    Equipment Recommendations  Rolling walker with 5" wheels; Hospital bed; 3in1 (PT)    Recommendations for Other Services OT consult     Precautions / Restrictions Precautions Precautions: Fall      Mobility  Bed Mobility Overal bed mobility: Needs Assistance Bed Mobility: Supine to Sit;Sit to Supine     Supine to sit: Mod assist;HOB elevated Sit to supine: Min assist      Transfers Overall transfer level: Needs assistance Equipment used: Rolling walker (2 wheeled) Transfers: Sit to/from Stand Sit to Stand: Mod assist;+2 physical assistance            Ambulation/Gait Ambulation/Gait assistance: Mod assist Ambulation Distance (Feet): 15 Feet (toileted, 15) Assistive device: Rolling walker (2 wheeled) Gait Pattern/deviations: Step-through pattern;Decreased stride length;Shuffle;Trunk flexed     General Gait Details: pt requires constant assist to manage RW (pushes too far ahead and directing it);   Stairs            Wheelchair Mobility    Modified Rankin (Stroke Patients Only)       Balance Overall balance assessment: Needs assistance Sitting-balance support: No upper extremity supported;Feet unsupported Sitting balance-Leahy Scale: Fair     Standing balance support:  Bilateral upper extremity supported Standing balance-Leahy Scale: Poor                               Pertinent Vitals/Pain Pain Assessment: Faces Faces Pain Scale: No hurt    Home Living Family/patient expects to be discharged to:: Private residence Living Arrangements: Other relatives Available Help at Discharge: Family;Available 24 hours/day             Additional Comments: pt unable to state; dementia    Prior Function Level of Independence: Needs assistance   Gait / Transfers Assistance Needed: per grandaughter (arrived while pt walking to bathroom) pt did not walk with RW and for past 4 months has progressively gotten weaker           Hand Dominance        Extremity/Trunk Assessment   Upper Extremity Assessment: Defer to OT evaluation;Overall WFL for tasks assessed           Lower Extremity Assessment: Generalized weakness      Cervical / Trunk Assessment: Kyphotic  Communication   Communication: HOH  Cognition Arousal/Alertness: Awake/alert Behavior During Therapy: WFL for tasks assessed/performed Overall Cognitive Status: History of cognitive impairments - at baseline                      General Comments      Exercises        Assessment/Plan    PT Assessment Patient needs continued PT services  PT Diagnosis Difficulty walking;Generalized weakness   PT Problem List Decreased  strength;Decreased activity tolerance;Decreased balance;Decreased mobility;Decreased cognition;Decreased knowledge of use of DME;Decreased safety awareness  PT Treatment Interventions DME instruction;Gait training;Stair training;Functional mobility training;Therapeutic activities;Therapeutic exercise;Balance training;Patient/family education   PT Goals (Current goals can be found in the Care Plan section) Acute Rehab PT Goals Patient Stated Goal: pt unable due to dementia; grandaughter wants to care for pt at home PT Goal Formulation: With  family Time For Goal Achievement: 04/05/15 Potential to Achieve Goals: Fair    Frequency Min 3X/week   Barriers to discharge        Co-evaluation               End of Session Equipment Utilized During Treatment: Gait belt Activity Tolerance: Patient limited by fatigue Patient left: in bed;with call bell/phone within reach;with bed alarm set;with family/visitor present Nurse Communication: Mobility status         Time: 5409-8119 PT Time Calculation (min) (ACUTE ONLY): 33 min   Charges:   PT Evaluation $Initial PT Evaluation Tier I: 1 Procedure PT Treatments $Gait Training: 8-22 mins   PT G Codes:        Tahirih Lair 04/07/2015, 4:48 PM  Pager [phone]

## 2015-03-29 NOTE — H&P (Addendum)
[facility] History and Physical  Bridget Cisneros NWG:[id number] DOB: April 18, 1916 DOA: 03/28/2015  Referring physician: ED physician PCP: Lauree Chandler, NP  Specialists:   Chief Complaint: generalized weakness, nausea, vomiting, headache  HPI: Bridget Cisneros is a 81 y.o. female with PMH of hypertension, Alzheimer's disease, chronic kidney disease-stage III, who presents with generalized weakness, nausea, vomiting, headache.  Patient has dementia and is unable to provide accurate medical history, therefore, most of the history is obtained by discussing the case with ED physician, per EMS report, and with her granddaughters. Per her granddaughter, patient has been feeling weak in the past 4 months. She has a decreased appetite and very poor oral intake, which has been progressively getting worse. Per her granddaughter, patient seems to have headaches over left temporal area, no vision change. Her granddaughter states that the patient seems to have right eye proptosis, but there is no proptosis to my examination. Patient moves all extremities, no unilateral weakness.  Patient has nausea, and vomited 3 times without blood in the vomitus today. She does not have abdominal pain, diarrhea, symptoms of UTI. No chest pain, shortness of breath, cough, runny nose, sore throat or rashes.  In ED, patient was found to have WBC 10.6, negative urinalysis for UTI, lipase 30, normal total bilirubin, AST 57, ALT 53, ALP 109, worsening renal function, temperature normal, no tachycardia, electrolytes okay. Negative chest x-ray and a negative CT scan for acute abnormalities. CT abdomen/pelvis without contrast showed no hydronephrosis or nephrolithiasis, diverticulosis without evidence of bowel obstruction or inflammation, Interval increase in the size of the left ovarian cystic lesion. Patient is admitted to inpatient for further eval and treatment.  Where does patient live?   At home    Can patient  participate in ADLs?  None  Review of Systems: could not be reviewed accurately due to dementia  Allergy: No Known Allergies  Past Medical History  Diagnosis Date  . Alzheimer's disease   . Hypertension     History reviewed. No pertinent past surgical history.  Social History:  reports that she has never smoked. She does not have any smokeless tobacco history on file. She reports that she does not drink alcohol or use illicit drugs.  Family History: Could not be reviewed due to dementia  Prior to Admission medications   Medication Sig Start Date End Date Taking? Authorizing Provider  amLODipine (NORVASC) 10 MG tablet TAKE ONE TABLET BY MOUTH ONCE DAILY FOR BLOOD PRESSURE 02/15/15  Yes Lauree Chandler, NP  AMBULATORY NON FORMULARY MEDICATION Depends Size Large Dx: 585.3 and 331.0 12/22/13   Blanchie Serve, MD  hydrochlorothiazide (HYDRODIURIL) 25 MG tablet TAKE ONE TABLET BY MOUTH ONCE DAILY Patient not taking: Reported on 03/28/2015 02/15/15   Lauree Chandler, NP  valACYclovir (VALTREX) 1000 MG tablet Take 1 tablet (1,000 mg total) by mouth 3 (three) times daily. 06/07/14   Gayland Curry, DO    Physical Exam: Filed Vitals:   03/28/15 2330 03/29/15 0000 03/29/15 0015 03/29/15 0333  BP: 112/57 114/60  145/69  Pulse: 68 67 57 74  Temp:    97.7 F (36.5 C)  TempSrc:    Oral  Resp: '17 15 18 16  ' SpO2: 98% 99% 100% 98%   General: Not in acute distress. Dry mucous and membrane HEENT:       Eyes: PERRL, EOMI, no scleral icterus.       ENT: No discharge from the ears and nose, no pharynx injection, no tonsillar enlargement.  There is no cord like structure in her scalp or scalp tenderness.        Neck: No JVD, no bruit, no mass felt. Heme: No neck lymph node enlargement. Cardiac: S1/S2, RRR, No murmurs, No gallops or rubs. Pulm: No rales, wheezing, rhonchi or rubs. Abd: Soft, nondistended, nontender, no rebound pain, no organomegaly, BS present. Ext: No pitting leg edema  bilaterally. 2+DP/PT pulse bilaterally. Musculoskeletal: No joint deformities, No joint redness or warmth, no limitation of ROM in spin. Skin: No rashes.  Neuro: Alert, confused, not oriented X3, cranial nerves II-XII grossly intact, moves all extremities. No eye proptosis  Psych: Patient is not psychotic, no suicidal or hemocidal ideation.  Labs on Admission:  Basic Metabolic Panel:  Recent Labs Lab 03/28/15 2219  NA 137  K 4.0  CL 103  CO2 21*  GLUCOSE 133*  BUN 56*  CREATININE 2.38*  CALCIUM 9.3   Liver Function Tests:  Recent Labs Lab 03/28/15 2219  AST 57*  ALT 53  ALKPHOS 109  BILITOT 1.0  PROT 7.7  ALBUMIN 3.2*    Recent Labs Lab 03/28/15 2219  LIPASE 30   No results for input(s): AMMONIA in the last 168 hours. CBC:  Recent Labs Lab 03/28/15 2219 03/29/15 0430  WBC 10.6* 8.0  HGB 11.9* 11.5*  HCT 37.4 34.6*  MCV 84.2 87.2  PLT 486* 436*   Cardiac Enzymes: No results for input(s): CKTOTAL, CKMB, CKMBINDEX, TROPONINI in the last 168 hours.  BNP (last 3 results) No results for input(s): BNP in the last 8760 hours.  ProBNP (last 3 results) No results for input(s): PROBNP in the last 8760 hours.  CBG: No results for input(s): GLUCAP in the last 168 hours.  Radiological Exams on Admission: Ct Abdomen Pelvis Wo Contrast  03/29/2015  CLINICAL DATA:  81 year old female with nausea vomiting. EXAM: CT ABDOMEN AND PELVIS WITHOUT CONTRAST TECHNIQUE: Multidetector CT imaging of the abdomen and pelvis was performed following the standard protocol without IV contrast. COMPARISON:  CT dated 09/12/2012 FINDINGS: Evaluation of this exam is limited in the absence of intravenous contrast. Evaluation is also limited due to streak artifact caused by patient's arms. Bibasilar linear atelectasis/scarring. There is slight hypo attenuation of the cardiac blood pool compatible with a degree of anemia. There is coronary vascular calcification. No intra-abdominal free air  or free fluid. Stable subcentimeter right hepatic hypodense lesion is not characterized. The liver is otherwise unremarkable. The gallbladder, pancreas, spleen, and the adrenal glands appear unremarkable. Stable appearing 3 cm left renal upper pole hypodense lesion which is not characterized on this noncontrast study but likely represents a cyst. There is moderate bilateral renal atrophy. There is no hydronephrosis or nephrolithiasis on either side. The visualized ureters appear unremarkable. There is apparent diffuse thickening of the bladder wall which may be partly related to underdistention. Cystitis is not excluded. Correlation with urinalysis recommended. The uterus appears grossly unremarkable. There is a cystic lesion in the left hemipelvis measuring approximately 10.4 x 7.5 cm (previously 6.7 x 5.8 cm) likely arising from the left ovary. There scattered sigmoid diverticula without active inflammation. There is no evidence of bowel obstruction or inflammation. Normal appendix. There is aortoiliac atherosclerotic disease. No portal venous gas identified. There is no adenopathy. The abdominal wall soft tissues appear unremarkable. There is osteopenia with degenerative changes of the spine. There multilevel disc desiccation with vacuum phenomena. No acute fracture. IMPRESSION: No hydronephrosis or nephrolithiasis. Correlation with urinalysis recommended to exclude UTI. Diverticulosis. No evidence of bowel  obstruction or inflammation. Normal appendix. Interval increase in the size of the left ovarian cystic lesion. Ultrasound may provide better evaluation Electronically Signed   By: Anner Crete M.D.   On: 03/29/2015 00:22   Dg Chest 2 View  03/28/2015  CLINICAL DATA:  81 year old female with nausea and vomiting EXAM: CHEST  2 VIEW COMPARISON:  Chest radiograph dated 09/12/2012 FINDINGS: The patient is kyphotic. The lung apices are not visualized obscured by patient's mandible. Two views of the chest do  not demonstrate focal consolidation. No pleural effusion or pneumothorax. Stable cardiac silhouette. The osseous structures are grossly unremarkable. IMPRESSION: No active cardiopulmonary disease. Electronically Signed   By: Anner Crete M.D.   On: 03/28/2015 23:37   Ct Head Wo Contrast  03/29/2015  CLINICAL DATA:  81 year old female with altered mental status, nausea and vomiting. Patient complains of left-sided headache. EXAM: CT HEAD WITHOUT CONTRAST TECHNIQUE: Contiguous axial images were obtained from the base of the skull through the vertex without intravenous contrast. COMPARISON:  None. FINDINGS: The ventricles are dilated and the sulci are prominent compatible with age-related atrophy. Periventricular and deep white matter hypodensities represent chronic microvascular ischemic changes. There is no intracranial hemorrhage. No mass effect or midline shift identified. There is mucoperiosteal thickening and partial opacification of the left maxillary sinus, sphenoid sinuses, and multiple ethmoid air cells. The mastoid air cells are well aerated. The calvarium is intact. IMPRESSION: No acute intracranial hemorrhage. Age-related atrophy and chronic microvascular ischemic disease. If symptoms persist and there are no contraindications, MRI may provide better evaluation if clinically indicated. Electronically Signed   By: Anner Crete M.D.   On: 03/29/2015 00:13    EKG: Independently reviewed. QTC 468, left bundle blockade and first degree AV block which existed in previous EKG on 09/12/12, PVC, T-wave inversion in lateral leads, V5-V6, and 82, bigeminy.   Assessment/Plan Principal Problem:   Generalized weakness Active Problems:   Essential hypertension, benign   Acute renal failure superimposed on stage 3 chronic kidney disease (HCC)   Alzheimer's disease   Anemia   Nausea & vomiting   AKI (acute kidney injury) ([place])   Ovarian cyst   Headache  Generalized weakness: likely  multifactorial, including deconditioning, dehydration secondary to nausea/vomiting and worsening renal function. Patient does not have focal neurologic findings on physical examination. Patient's granddaughter states that the patient may have right eye ptosis, but no ptosis on my examination, less likely to have stroke.  -will admit to tele bed -treat underlying issues as below -IVF -PT/Ot  Nausea & vomiting: Etiology is not clear. CT abdomen/pelvis without contrast is not impressive. May be due to viral gastritis. -hydroxyzine when necessary for nausea -IV fluids: Normal saline 1 L bolus, followed by 75 mL per hour  Headache: Patient's granddaughter reports that the patient may have left temporal headache. The most important differential diagnosis is giant cell arteritis. Patient does not have vision change. No cordlike structure or scalp tenderness on examination, less likely to have arthritis. -check ESR and CPR. If significantly elevated, will start steroid.  Addendum: CRP comes back elevated at 10.7 and ESR 110-->Indicating possible giant cell arteritis -Solu-Medrol 60 mg times one by IV -then prednisone 40 mg daily -may call surgeon for Biopsy  Essential hypertension: -continue amlodipine -Hold HCTZ since patient has worsening renal function  AoCKD-III: Baseline Cre is  12-1.6, her Cre  2.38 is on admission. Likely due to prerenal secondary to dehydration and continuation of diruetics. No hydronephrosis on CT scan. - IVF  as above - Check FeUrea - Follow up renal function by BMP - Hold Diuretics, HCTZ  Alzheimer's disease: not on medications. - no acute issues.   Ovarian cyst: as showed on CT scan. -US-abd/pelvise for further evaluation.   DVT ppx: SQ Heparin  Code Status: DNR Family Communication:  Yes, patient's  2 granddaughter's at bed side Disposition Plan: Admit to inpatient   Date of Service 03/29/2015    Ivor Costa [facility] Pager [phone]  If  7PM-7AM, please contact night-coverage [email or web address] Password [facility] 03/29/2015, 5:17 AM

## 2015-03-29 NOTE — Progress Notes (Signed)
Utilization Review Completed.Bridget Cisneros T12/27/2016  

## 2015-03-30 ENCOUNTER — Encounter (HOSPITAL_COMMUNITY): Payer: Self-pay | Admitting: General Practice

## 2015-03-30 DIAGNOSIS — E440 Moderate protein-calorie malnutrition: Secondary | ICD-10-CM | POA: Diagnosis present

## 2015-03-30 DIAGNOSIS — N183 Chronic kidney disease, stage 3 (moderate): Secondary | ICD-10-CM

## 2015-03-30 DIAGNOSIS — R112 Nausea with vomiting, unspecified: Secondary | ICD-10-CM

## 2015-03-30 DIAGNOSIS — G309 Alzheimer's disease, unspecified: Secondary | ICD-10-CM

## 2015-03-30 DIAGNOSIS — R51 Headache: Secondary | ICD-10-CM

## 2015-03-30 DIAGNOSIS — F0280 Dementia in other diseases classified elsewhere without behavioral disturbance: Secondary | ICD-10-CM

## 2015-03-30 DIAGNOSIS — N179 Acute kidney failure, unspecified: Secondary | ICD-10-CM

## 2015-03-30 LAB — BASIC METABOLIC PANEL
ANION GAP: 12 (ref 5–15)
BUN: 37 mg/dL — ABNORMAL HIGH (ref 6–20)
CO2: 22 mmol/L (ref 22–32)
Calcium: 9 mg/dL (ref 8.9–10.3)
Chloride: 107 mmol/L (ref 101–111)
Creatinine, Ser: 1.4 mg/dL — ABNORMAL HIGH (ref 0.44–1.00)
GFR, EST AFRICAN AMERICAN: 36 mL/min — AB (ref >60)
GFR, EST NON AFRICAN AMERICAN: 31 mL/min — AB (ref >60)
GLUCOSE: 91 mg/dL (ref 65–99)
POTASSIUM: 4.2 mmol/L (ref 3.5–5.1)
SODIUM: 141 mmol/L (ref 135–145)

## 2015-03-30 LAB — URINE CULTURE: CULTURE: NO GROWTH

## 2015-03-30 LAB — GLUCOSE, CAPILLARY: GLUCOSE-CAPILLARY: 79 mg/dL (ref 65–99)

## 2015-03-30 MED ORDER — ENSURE ENLIVE PO LIQD: 237.0000 mL | Freq: Two times a day (BID) | ORAL | Status: AC

## 2015-03-30 MED ORDER — ENSURE ENLIVE PO LIQD
237.0000 mL | Freq: Two times a day (BID) | ORAL | Status: DC
  Administered 2015-03-30 – 2015-03-31 (×2): 237 mL via ORAL
  Filled 2015-03-30 (×4): qty 237

## 2015-03-30 NOTE — Progress Notes (Signed)
Patient is retaining urine was bladder scanned with 999 In/out of 900 will continue to monitor, will be discharged tomorrow.

## 2015-03-30 NOTE — Evaluation (Signed)
Occupational Therapy Evaluation Patient Details Name: Bridget Cisneros MRN: [id number] DOB: [birth year] Today's Date: 03/30/2015    History of Present Illness Adm 12/26 with N/V/abd pain, CT abd negative  PMHx- dementia, FTT, CKD, HTN   Clinical Impression   Patient presenting with decreased ADL and functional mobility independence. Unsure of patient's PLOF, no family present during this OT evaluation. Patient currently functioning at an overall total assist level for ADLs and requires up to mod assist +2 for safety and at times physical assist for functional mobility and transfers. Patient will benefit from acute OT to increase overall independence in the areas of ADLs, functional mobility, and overall safety in order to safely discharge to venue listed below. Discussed d/c plan with PT, PT states that granddaughter was present during PT evaluation and wants to take patient home. Granddaughter was not present during OT eval. IF granddaughter is able to provide the necessary total assist for 24/7, OK for patient to d/c back home. However, at this time OT recommendation is for SNF.     Follow Up Recommendations  SNF;Supervision/Assistance - 24 hour    Equipment Recommendations  Other (comment) (TBD)    Recommendations for Other Services  None at this time   Precautions / Restrictions Precautions Precautions: Fall Restrictions Weight Bearing Restrictions: No    Mobility Bed Mobility Overal bed mobility: Needs Assistance Bed Mobility: Supine to Sit;Sit to Supine;Rolling Rolling: +2 for physical assistance;Mod assist (due to cognition)   Supine to sit: Mod assist;HOB elevated Sit to supine: Mod assist;+2 for physical assistance (due to cognition)   General bed mobility comments: max to total cueing for patient to follow commands   Transfers Overall transfer level: Needs assistance Equipment used: Rolling walker (2 wheeled) Transfers: Sit to/from [employer] Sit to  Stand: Mod assist;+2 safety/equipment Stand pivot transfers: Mod assist;+2 safety/equipment General transfer comment: max to total cueing for patient to follow commands     Balance Overall balance assessment: Needs assistance Sitting-balance support: No upper extremity supported;Feet supported Sitting balance-Leahy Scale: Fair     Standing balance support: Bilateral upper extremity supported;During functional activity Standing balance-Leahy Scale: Poor    ADL Overall ADL's : Needs assistance/impaired Eating/Feeding: Total assistance;Bed level Eating/Feeding Details (indicate cue type and reason): Pt unable due to cognition  Grooming: Total assistance;Bed level   Upper Body Bathing: Total assistance;Sitting   Lower Body Bathing: Total assistance;Bed level   Upper Body Dressing : Total assistance;Bed level   Lower Body Dressing: Total assistance;Bed level   Toilet Transfer: Moderate assistance;Cueing for safety;RW;BSC;Stand-pivot   Toileting- Clothing Manipulation and Hygiene: Total assistance;Sit to/from stand       Functional mobility during ADLs: Moderate assistance;Rolling walker;+2 for safety/equipment General ADL Comments: Pt overall total assist due to cognition. Pt unable to comprehend and follow commands.      Vision Additional Comments: Difficult to fully assess secondary to patient's poor cognition. During eval, pt stated "I don't know how many people are in here" and "where are you, I can't see you" (therapist standing to the left of her). When therapist attempting to ask visiual questions pt unable to answer.           Pertinent Vitals/Pain Faces Pain Scale: No hurt    Extremity/Trunk Assessment Upper Extremity Assessment Upper Extremity Assessment: Generalized weakness   Lower Extremity Assessment Lower Extremity Assessment: Generalized weakness   Cervical / Trunk Assessment Cervical / Trunk Assessment: Kyphotic   Communication  Communication Communication: HOH   Cognition Arousal/Alertness: Awake/alert Behavior  During Therapy: WFL for tasks assessed/performed Overall Cognitive Status: No family/caregiver present to determine baseline cognitive functioning (h/o cognitive impairments at baseline)               Home Living Family/patient expects to be discharged to:: Private residence Living Arrangements: Other relatives;Children Available Help at Discharge: Family;Available 24 hours/day Additional Comments: Pt unable to state; dementia - no family present. All information re-entered from PT's eval      Prior Functioning/Environment Level of Independence: Needs assistance  Gait / Transfers Assistance Needed: per grandaughter, pt did not walk with RW and for past 4 months has progressively gotten weaker - PER PT EVAL    OT Diagnosis: Generalized weakness;Cognitive deficits   OT Problem List: Decreased strength;Decreased activity tolerance;Impaired balance (sitting and/or standing);Impaired vision/perception;Decreased coordination;Decreased safety awareness;Decreased knowledge of use of DME or AE;Decreased knowledge of precautions   OT Treatment/Interventions: Self-care/ADL training;Therapeutic exercise;Energy conservation;DME and/or AE instruction;Therapeutic activities;Patient/family education;Balance training    OT Goals(Current goals can be found in the care plan section) Acute Rehab OT Goals Patient Stated Goal: pt unable due to dementia OT Goal Formulation: Patient unable to participate in goal setting Time For Goal Achievement: 04/13/15 Potential to Achieve Goals: Fair ADL Goals Pt Will Perform Grooming: with min assist;sitting Pt Will Perform Upper Body Bathing: with min assist;sitting Pt Will Perform Lower Body Bathing: sit to/from stand;with mod assist Pt Will Perform Upper Body Dressing: with min assist;sitting Pt Will Perform Lower Body Dressing: with mod assist;sit to/from stand Pt Will  Transfer to Toilet: with min assist;ambulating;bedside commode Additional ADL Goal #1: Pt will follow one step commands consistently with mod verbal cueing  OT Frequency: Min 2X/week   Barriers to D/C: unsure, no family present during OT eval   End of Session Equipment Utilized During Treatment: Gait belt;Rolling walker Nurse Communication: Mobility status;Other (comment) (pt with incontinence )  Activity Tolerance: Patient tolerated treatment well (limited by cognition) Patient left: in bed;with call bell/phone within reach;with bed alarm set   Time: [id number] OT Time Calculation (min): 25 min Charges:  OT General Charges $OT Visit: 1 Procedure OT Evaluation $Initial OT Evaluation Tier I: 1 Procedure OT Treatments $Self Care/Home Management : 8-22 mins  Edwin CapPatricia Nimah Uphoff , MS, OTR/L, CLT Pager: [phone]  03/30/2015, 12:40 PM

## 2015-03-30 NOTE — Progress Notes (Signed)
Initial Nutrition Assessment  DOCUMENTATION CODES:   Non-severe (moderate) malnutrition in context of chronic illness  INTERVENTION:  Provide Ensure Enlive po BID, each supplement provides 350 kcal and 20 grams of protein Monitor PO intake for adequacy   NUTRITION DIAGNOSIS:   Predicted suboptimal nutrient intake related to lethargy/confusion, chronic illness as evidenced by per patient/family report, meal completion < 25%.   GOAL:   Patient will meet greater than or equal to 90% of their needs   MONITOR:   PO intake, Supplement acceptance, Labs, Weight trends, Skin, I & O's  REASON FOR ASSESSMENT:   Consult Assessment of nutrition requirement/status  ASSESSMENT:   81 year old female with history of hypertension, Alzheimer's disease with severe dementia, chronic kidney disease stage III presented with generalized weakness associated with nausea, vomiting and headache. History was admitted on admission from the family. As per her granddaughter she was feeling weak for the past 4 months with poor appetite and very poor by mouth intake  Pt alert, friendly, and talkative but, very confused at time of visit. She denies any nausea or abdominal pain. RN reports that patient ate a small amount for breakfast and would likely benefit from supplements. Per nursing notes, she ate 20% of dinner last night. RD brought pt chocolate Ensure Enlive which she reports liking. No family present. Pt has moderate muscle and fat wasting per nutrition-focused physical exam. No new weight on file.  Labs: elevated BUN/creatinine, elevated CRP  Diet Order:  Diet regular Room service appropriate?: Yes; Fluid consistency:: Thin  Skin:  Reviewed, no issues  Last BM:  12/25  Height:   Ht Readings from Last 1 Encounters:  03/30/15 4' 10.66" (1.49 m)    Weight:   Wt Readings from Last 1 Encounters:  06/07/14 164 lb (74.39 kg)    Ideal Body Weight:  44.7 kg  BMI:  There is no weight on file to  calculate BMI.  Estimated Nutritional Needs:   Kcal:  1500-1700  Protein:  85-95 grams  Fluid:  1.7 L/day  EDUCATION NEEDS:   No education needs identified at this time  Dorothea Ogleeanne Kynli Chou RD, LDN Inpatient Clinical Dietitian Pager: [phone] After Hours Pager: [phone]

## 2015-03-30 NOTE — Discharge Summary (Addendum)
Physician Discharge Summary  Bridget Cisneros GLO:[id number] DOB: 23-Dec-1916 DOA: 03/28/2015  PCP: Lauree Chandler, NP  Admit date: 03/28/2015 Discharge date: 03/31/2015  Time spent: 25 minutes  Recommendations for Outpatient Follow-up:  1. Discharge home with home health RN and PT. Follow-up with PCP in 1-2 weeks. Recheck renal function during outpatient follow-up. 2. Granddaughter instructed on encouraging patient on increased nutritional intake and supplements provided. If patient still has very poor intake despite this she may need of PEG tube for feeding. This can be addressed by her PCP and make necessary outpatient referral.   Discharge Diagnoses:  Principal Problem:   Generalized weakness   Active Problems:   Acute renal failure superimposed on stage 3 chronic kidney disease (HCC)   Essential hypertension, benign   Alzheimer's disease   Anemia   Nausea & vomiting   Ovarian cyst   Headache with ? Temporal arteritis   Malnutrition of moderate degree   Discharge Condition: Fair  Diet recommendation: Regular diet with supplement  There were no vitals filed for this visit.  History of present illness:  81 year old female with history of hypertension, Alzheimer's disease with severe dementia, chronic kidney disease stage III presented with generalized weakness associated with nausea, vomiting and headache. History was admitted on admission from the family. As per her granddaughter she was feeling weak for the past 4 months with poor appetite and very poor by mouth intake. Patient? Complaining of headache in her left temporal area without any visual changes. On the day of admission she had 3 episodes of nausea with vomiting. she was afebrile An physical exam her neurological exam was unremarkable. In the ED her white count was 10.6 UA was unremarkable, transaminitis with acute on chronic kidney injury. Chest x-ray and head CT were unremarkable. CT of the abdomen and pelvis  was negative for acute intra-abdominal abnormality. Patient admitted for further management. Her ESR and CRP were elevated (110 and 10.7 respectively) and patient was started on oral prednisone with concern for temporal arteritis.  Hospital Course:  Generalized weakness with failure to thrive with moderate malnutrition Appears to be progressive deconditioning. Patient also dehydrated with poor by mouth intake which is further worsened with nausea and vomiting. No focal neurological deficit on exam. Given IV fluids. Nutrition consulted and added supplement. As per granddaughter patient has poor by mouth intake at home. instructed to encourage po intake routinely. If not improved and progressively has poor po intake may need a feeding gastrcic tube. This can be addressed by her PCP during outpt follow up.   Nausea and vomiting No clear etiology. May have viral gastritis/gastroenteritis. resolved with  IV fluids and when necessary hydroxyzine for nausea. Nutrition consult  Headache Reportedly had a left temporal headache program daughters. Her ESR and CRP were elevated. She was started on empiric prednisone. On my exam this afternoon patient denies any symptoms, is not in any distress or have any tenderness or palpable lesion in her temples. I would consider further workup including a temporal artery biopsy and empiric steroid at this 81 year old demented lady. I have discussed this with neurology Dr. Nicole Kindred who recommends the same.  Acute on chronic kidney disease stage III Baseline creatinine of 1.6. Possibly prerenal secondary to dehydration. Continue IV fluids. Discontinued diuretics. CT abdomen and pelvis negative.   Severe dementia  Essential hypertension Continue amlodipine  Acute urinary retention Scan done on 12/28 with >90 mL urine. Straight cath done. Now able to voided without difficulty.  Patient can be discharged  home with outpatient PCP follow-up.  CODE STATUS: DO NOT  RESUSCITATE    Family Communication: discussed with her  her granddaughters  Disposition Plan:  Seen by PT. Home with HH.    Consultants: None  Procedures:  Head CT  CT abdomen and pelvis      Discharge Exam: Filed Vitals:   03/30/15 1016 03/30/15 1419  BP: 151/58 142/74  Pulse: 66 64  Temp: 98.1 F (36.7 C) 98.7 F (37.1 C)  Resp: 15 15     General: Elderly thin built female not in distress, confused  HEENT: No pallor, moist mucosa. No temporal lesion , palpable cord or swelling  chest: Clear bilaterally  CVS: Normal S1 and S2, no murmurs  GI: Soft, nondistended, nontender  Musculoskeletal: Warm, no edema  CNS: Awake but not oriented   Discharge Instructions    Current Discharge Medication List    START taking these medications   Details  feeding supplement, ENSURE ENLIVE, (ENSURE ENLIVE) LIQD Take 237 mLs by mouth 2 (two) times daily after a meal. Qty: 237 mL, Refills: 12      CONTINUE these medications which have NOT CHANGED   Details  amLODipine (NORVASC) 10 MG tablet TAKE ONE TABLET BY MOUTH ONCE DAILY FOR BLOOD PRESSURE Qty: 90 tablet, Refills: 1    AMBULATORY NON FORMULARY MEDICATION Depends Size Large Dx: 585.3 and 331.0 Qty: 2 Package, Refills: 11    valACYclovir (VALTREX) 1000 MG tablet Take 1 tablet (1,000 mg total) by mouth 3 (three) times daily. Qty: 30 tablet, Refills: 0   Associated Diagnoses: Herpes zoster involving sacral dermatome      STOP taking these medications     hydrochlorothiazide (HYDRODIURIL) 25 MG tablet        No Known Allergies Follow-up Information    Follow up with EUBANKS, JESSICA K, NP. Schedule an appointment as soon as possible for a visit in 2 weeks.   Specialty:  Geriatric Medicine   Contact information:   [address]. [place] [address] [phone]        The results of significant diagnostics from this hospitalization (including imaging, microbiology, ancillary and laboratory)  are listed below for reference.    Significant Diagnostic Studies: Ct Abdomen Pelvis Wo Contrast  03/29/2015  CLINICAL DATA:  81 year old female with nausea vomiting. EXAM: CT ABDOMEN AND PELVIS WITHOUT CONTRAST TECHNIQUE: Multidetector CT imaging of the abdomen and pelvis was performed following the standard protocol without IV contrast. COMPARISON:  CT dated 09/12/2012 FINDINGS: Evaluation of this exam is limited in the absence of intravenous contrast. Evaluation is also limited due to streak artifact caused by patient's arms. Bibasilar linear atelectasis/scarring. There is slight hypo attenuation of the cardiac blood pool compatible with a degree of anemia. There is coronary vascular calcification. No intra-abdominal free air or free fluid. Stable subcentimeter right hepatic hypodense lesion is not characterized. The liver is otherwise unremarkable. The gallbladder, pancreas, spleen, and the adrenal glands appear unremarkable. Stable appearing 3 cm left renal upper pole hypodense lesion which is not characterized on this noncontrast study but likely represents a cyst. There is moderate bilateral renal atrophy. There is no hydronephrosis or nephrolithiasis on either side. The visualized ureters appear unremarkable. There is apparent diffuse thickening of the bladder wall which may be partly related to underdistention. Cystitis is not excluded. Correlation with urinalysis recommended. The uterus appears grossly unremarkable. There is a cystic lesion in the left hemipelvis measuring approximately 10.4 x 7.5 cm (previously 6.7 x 5.8 cm) likely  arising from the left ovary. There scattered sigmoid diverticula without active inflammation. There is no evidence of bowel obstruction or inflammation. Normal appendix. There is aortoiliac atherosclerotic disease. No portal venous gas identified. There is no adenopathy. The abdominal wall soft tissues appear unremarkable. There is osteopenia with degenerative changes of  the spine. There multilevel disc desiccation with vacuum phenomena. No acute fracture. IMPRESSION: No hydronephrosis or nephrolithiasis. Correlation with urinalysis recommended to exclude UTI. Diverticulosis. No evidence of bowel obstruction or inflammation. Normal appendix. Interval increase in the size of the left ovarian cystic lesion. Ultrasound may provide better evaluation Electronically Signed   By: Anner Crete M.D.   On: 03/29/2015 00:22   Dg Chest 2 View  03/28/2015  CLINICAL DATA:  81 year old female with nausea and vomiting EXAM: CHEST  2 VIEW COMPARISON:  Chest radiograph dated 09/12/2012 FINDINGS: The patient is kyphotic. The lung apices are not visualized obscured by patient's mandible. Two views of the chest do not demonstrate focal consolidation. No pleural effusion or pneumothorax. Stable cardiac silhouette. The osseous structures are grossly unremarkable. IMPRESSION: No active cardiopulmonary disease. Electronically Signed   By: Anner Crete M.D.   On: 03/28/2015 23:37   Ct Head Wo Contrast  03/29/2015  CLINICAL DATA:  81 year old female with altered mental status, nausea and vomiting. Patient complains of left-sided headache. EXAM: CT HEAD WITHOUT CONTRAST TECHNIQUE: Contiguous axial images were obtained from the base of the skull through the vertex without intravenous contrast. COMPARISON:  None. FINDINGS: The ventricles are dilated and the sulci are prominent compatible with age-related atrophy. Periventricular and deep white matter hypodensities represent chronic microvascular ischemic changes. There is no intracranial hemorrhage. No mass effect or midline shift identified. There is mucoperiosteal thickening and partial opacification of the left maxillary sinus, sphenoid sinuses, and multiple ethmoid air cells. The mastoid air cells are well aerated. The calvarium is intact. IMPRESSION: No acute intracranial hemorrhage. Age-related atrophy and chronic microvascular ischemic  disease. If symptoms persist and there are no contraindications, MRI may provide better evaluation if clinically indicated. Electronically Signed   By: Anner Crete M.D.   On: 03/29/2015 00:13   US Pelvis Complete  03/29/2015  CLINICAL DATA:  Left ovarian cyst. EXAM: TRANSABDOMINAL ULTRASOUND OF PELVIS TECHNIQUE: Transabdominal ultrasound examination of the pelvis was performed including evaluation of the uterus, ovaries, adnexal regions, and pelvic cul-de-sac. COMPARISON:  CT 03/28/2015, 09/12/2012. FINDINGS: Uterus Measurements: 9.6 x 2.3 x 4.4 cm. No fibroids or other mass visualized. Endometrium Thickness: 17 mm.  Heterogeneous echotexture. Right ovary Not visualized. Left ovary Measurements: 10.9 x 5.2 x 10.2 cm. 10.2 x 4.4 x 9.0 cm cyst. Minimal thickening of the cyst wall cannot be excluded. Other findings: No abnormal free fluid. Limited exam due to patient's clinical condition. IMPRESSION: 1. Thickening of the endometrium is 17 mm. Endometrium is heterogeneous in echotexture . Endometrial thickness is considered abnormal for an asymptomatic post-menopausal female. Endometrial sampling should be considered to exclude carcinoma. 2. 10.2 x 4.9 x 9.0 cm left ovarian cyst. Minimal thickening of the cyst wall cannot be excluded. Since these may be difficult to assess completely with [place], further evaluation of simple-appearing cysts >7 cm with MRI or surgical evaluation is recommended according to the Society of Radiologists in Ultrasound 2010 Consensus Conference Statement (D Clovis Riley et al. Management of Asymptomatic Ovarian and other Adnexal Cysts Imaged at [place]: Society of Radiologists in [place] Statement 2010. Radiology 256 (Sept 2010): 811-914.). Electronically Signed   By: Marcello Moores  Register  On: 03/29/2015 08:05    Microbiology: Recent Results (from the past 240 hour(s))  Urine culture     Status: None   Collection Time: 03/28/15  1:34 AM  Result Value Ref Range Status    Specimen Description URINE, CLEAN CATCH  Final   Special Requests NONE  Final   Culture NO GROWTH 1 DAY  Final   Report Status 03/30/2015 FINAL  Final     Labs: Basic Metabolic Panel:  Recent Labs Lab 03/28/15 2219 03/29/15 0430 03/30/15 0458  NA 137 139 141  K 4.0 4.7 4.2  CL 103 108 107  CO2 21* 20* 22  GLUCOSE 133* 112* 91  BUN 56* 52* 37*  CREATININE 2.38* 1.98* 1.40*  CALCIUM 9.3 8.7* 9.0   Liver Function Tests:  Recent Labs Lab 03/28/15 2219 03/29/15 0430  AST 57* 71*  ALT 53 63*  ALKPHOS 109 95  BILITOT 1.0 1.0  PROT 7.7 7.1  ALBUMIN 3.2* 2.9*    Recent Labs Lab 03/28/15 2219  LIPASE 30   No results for input(s): AMMONIA in the last 168 hours. CBC:  Recent Labs Lab 03/28/15 2219 03/29/15 0430  WBC 10.6* 8.0  HGB 11.9* 11.5*  HCT 37.4 34.6*  MCV 84.2 87.2  PLT 486* 436*   Cardiac Enzymes:  Recent Labs Lab 03/29/15 0430 03/29/15 1110 03/29/15 1629  TROPONINI 0.05* <0.03 <0.03   BNP: BNP (last 3 results) No results for input(s): BNP in the last 8760 hours.  ProBNP (last 3 results) No results for input(s): PROBNP in the last 8760 hours.  CBG:  Recent Labs Lab 03/30/15 0628  GLUCAP 79       Signed:  Louellen Molder MD   [facility] 03/30/2015, 2:23 PM

## 2015-03-30 NOTE — Care Management Note (Signed)
Case Management Note  Patient Details  Name: Avon Gullynnie Ruth Cloke MRN: [id number] Date of Birth: 1916-09-08  Subjective/Objective:                    Action/Plan: Plan is for patient to discharge home with home health services and DME. CM spoke with Ms Adriana SimasCook who patient lives with and she asked that CM call Britta MccreedyBarbara patients daughter in law to set up services. CM spoke with Britta MccreedyBarbara [phone]) and went over the home health agencies in [place] and the DME ordered. She selected [facility]. [facility] PikesSusan [facility] notified and accepted the referral. Patient also to have walker and hospital bed. CM spoke with Vaughan BastaJermaine with [facility] and they will bring up the walker but unable to deliver hospital bed until tomorrow (Ms Adriana SimasCook aware). Upon notifying the bedside RN of home health set up, he notified CM that patient is unable to void and is retaining urine. CM notified Dr Gonzella Lexhungel and he cancelled the discharge until tomorrow. Ms Adriana SimasCook called and made aware and will notify [facility].   Expected Discharge Date:                  Expected Discharge Plan:  Home w Home Health Services  In-House Referral:     Discharge planning Services  CM Consult  Post Acute Care Choice:  Home Health, Durable Medical Equipment Choice offered to:  Adult Children  DME Arranged:  Walker rolling, Hospital bed DME Agency:  [facility]  [facility]  [facility]  Status of Service:  In process, will continue to follow  Medicare Important Message Given:    Date Medicare IM Given:    Medicare IM give by:    Date Additional Medicare IM Given:    Additional Medicare Important Message give by:     If discussed at Long Length of Stay Meetings, dates discussed:    Additional Comments:  Kermit BaloKelli F Jaterrius Ricketson, RN 03/30/2015, 3:39 PM

## 2015-03-31 ENCOUNTER — Telehealth: Payer: Self-pay | Admitting: *Deleted

## 2015-03-31 LAB — GLUCOSE, CAPILLARY: GLUCOSE-CAPILLARY: 92 mg/dL (ref 65–99)

## 2015-03-31 LAB — UREA NITROGEN, URINE: UREA NITROGEN UR: 616 mg/dL

## 2015-03-31 NOTE — Telephone Encounter (Signed)
Spoke with caregiver this morning and patient is still in hospital and will be releasing her with order for hospital bed. Patient has a follow up appointment with Bridget Cisneros on 04/12/15

## 2015-03-31 NOTE — Progress Notes (Signed)
[facility] is going to deliver Ms Null's hospital bed out to her caretakers (Ms Adriana SimasCook) home today. CM spoke with Ms Adriana SimasCook and she is going to call and let us know when bed is set up so Ms Durwin NoraDixon can be transported home. Ms Adriana SimasCook also requested bed pads for home use. CM spoke with Judeth CornfieldStephanie at [facility] and they will have the nurse take them when they go see the patient.

## 2015-03-31 NOTE — Clinical Social Work Note (Signed)
CSW received consult for patient needing ambulance transport home.  CSW contacted [facility] and patient is on the schedule to be transported.  CSW to sign off, please reconsult if other social work needs arise.  Bridget KnackEric R. Chay Mazzoni, MSW, Theresia MajorsLCSWA [phone] 03/31/2015 5:30 PM

## 2015-03-31 NOTE — Progress Notes (Addendum)
[facility] PROGRESS NOTE  Bridget Cisneros ZOX:[id number] DOB: 09/26/1916 DOA: 03/28/2015 PCP: Sharon SellerEUBANKS, JESSICA K, NP  Assessment/Plan:  Acute on chronic kidney disease stage III  Renal function returned to baseline with IV fluids.    Headache  Result.    Generalized weakness with moderate malnutrition and failure to thrive  Added supplements. PCP to address need for PEG tube placement if patient still has poor by mouth intake.   Stable to be discharged home.  HPI/Subjective: No overnight issues. Able to void without difficulty.  Objective: Filed Vitals:   03/31/15 0527 03/31/15 0932  BP: 140/79 142/57  Pulse: 70 67  Temp: 98.7 F (37.1 C) 99 F (37.2 C)  Resp: 20 20    Intake/Output Summary (Last 24 hours) at 03/31/15 1255 Last data filed at 03/31/15 0953  Gross per 24 hour  Intake     63 ml  Output   1500 ml  Net  -1437 ml   There were no vitals filed for this visit.  Exam:   General:  NAD  Cardiovascular: Normal S1 and S2  Respiratory: Clear bilaterally  Abdomen: Soft, nondistended  Data Reviewed: Basic Metabolic Panel:  Recent Labs Lab 03/28/15 2219 03/29/15 0430 03/30/15 0458  NA 137 139 141  K 4.0 4.7 4.2  CL 103 108 107  CO2 21* 20* 22  GLUCOSE 133* 112* 91  BUN 56* 52* 37*  CREATININE 2.38* 1.98* 1.40*  CALCIUM 9.3 8.7* 9.0   Liver Function Tests:  Recent Labs Lab 03/28/15 2219 03/29/15 0430  AST 57* 71*  ALT 53 63*  ALKPHOS 109 95  BILITOT 1.0 1.0  PROT 7.7 7.1  ALBUMIN 3.2* 2.9*    Recent Labs Lab 03/28/15 2219  LIPASE 30   No results for input(s): AMMONIA in the last 168 hours. CBC:  Recent Labs Lab 03/28/15 2219 03/29/15 0430  WBC 10.6* 8.0  HGB 11.9* 11.5*  HCT 37.4 34.6*  MCV 84.2 87.2  PLT 486* 436*   Cardiac Enzymes:  Recent Labs Lab 03/29/15 0430 03/29/15 1110 03/29/15 1629  TROPONINI 0.05* <0.03 <0.03   BNP (last 3 results) No results for input(s): BNP in the last 8760  hours.  ProBNP (last 3 results) No results for input(s): PROBNP in the last 8760 hours.  CBG:  Recent Labs Lab 03/30/15 0628 03/31/15 0639  GLUCAP 79 92    Recent Results (from the past 240 hour(s))  Urine culture     Status: None   Collection Time: 03/28/15  1:34 AM  Result Value Ref Range Status   Specimen Description URINE, CLEAN CATCH  Final   Special Requests NONE  Final   Culture NO GROWTH 1 DAY  Final   Report Status 03/30/2015 FINAL  Final     Studies: No results found.  Scheduled Meds: . amLODipine  2.5 mg Oral Daily  . feeding supplement (ENSURE ENLIVE)  237 mL Oral BID PC  . heparin  5,000 Units Subcutaneous 3 times per day  . sodium chloride  3 mL Intravenous Q12H   Continuous Infusions: . sodium chloride 75 mL/hr at 03/31/15 0335      Time spent: 15 minutes    Braileigh Landenberger  [facility] Pager [phone]. If 7PM-7AM, please contact night-coverage at [email or web address], password [email or web address] 03/31/2015, 12:55 PM  LOS: 2 days

## 2015-03-31 NOTE — Telephone Encounter (Signed)
Angelique Blonderenise, caregiver called and stated that patient is still in hospital due to kidneys. Patient has a follow up hospital appointment for 04/12/15. Confirmed this appointment with Caregiver.

## 2015-03-31 NOTE — Progress Notes (Signed)
Physical Therapy Treatment Patient Details Name: Bridget Cisneros Bridget Cisneros MRN: [id number] DOB: 03/23/1917 Today's Date: 03/31/2015    History of Present Illness Adm 12/26 with N/V/abd pain, CT abd negative  PMHx- dementia, FTT, CKD, HTN    PT Comments    Pt much improved from mobility stand point today. Pt functioning at minA level for transfers and modA for ambulation. Assist mostly for walker management due to patient not being familiar. Pt remains to impaired cognition due to h/o dementia however is safe to d/c home with 24/7 assist from family. If patient family can not provide this then she will need SNF.   Follow Up Recommendations  Home health PT;Supervision/Assistance - 24 hour     Equipment Recommendations  Rolling walker with 5" wheels;Hospital bed;3in1 (PT)    Recommendations for Other Services       Precautions / Restrictions Precautions Precautions: Fall Precaution Comments: dementia Restrictions Weight Bearing Restrictions: No    Mobility  Bed Mobility Overal bed mobility: Needs Assistance Bed Mobility: Supine to Sit Rolling: Min assist   Supine to sit: Min assist     General bed mobility comments: increased time required and max directional verbal and tactile cues to complete task. pt used long sit technique and was able to bring LEs off EOB and scoot to EOB.  Transfers Overall transfer level: Needs assistance Equipment used: Rolling walker (2 wheeled) Transfers: Sit to/from Stand Sit to Stand: Min assist         General transfer comment: minA to initiate transfer and max directional v/c's   Ambulation/Gait Ambulation/Gait assistance: Mod assist;Min assist Ambulation Distance (Feet): 75 Feet Assistive device: Rolling walker (2 wheeled) Gait Pattern/deviations: Step-through pattern;Decreased stride length;Trunk flexed Gait velocity: decreased Gait velocity interpretation: Below normal speed for age/gender General Gait Details: modA for walker  management and v/c's to look forwards/stand upright and not lean on RW with forearms. Pt with 2 standing rest breaks   Stairs            Wheelchair Mobility    Modified Rankin (Stroke Patients Only)       Balance Overall balance assessment: Needs assistance Sitting-balance support: No upper extremity supported Sitting balance-Leahy Scale: Fair     Standing balance support: Bilateral upper extremity supported Standing balance-Leahy Scale: Poor                      Cognition Arousal/Alertness: Awake/alert Behavior During Therapy: WFL for tasks assessed/performed Overall Cognitive Status: History of cognitive impairments - at baseline                      Exercises      General Comments        Pertinent Vitals/Pain Pain Assessment: No/denies pain    Home Living                      Prior Function            PT Goals (current goals can now be found in the care plan section) Progress towards PT goals: Progressing toward goals    Frequency  Min 3X/week    PT Plan Current plan remains appropriate    Co-evaluation             End of Session Equipment Utilized During Treatment: Gait belt Activity Tolerance: Patient tolerated treatment well Patient left: in chair;with call bell/phone within reach;with chair alarm set     Time: [id number] PT Time Calculation (  min) (ACUTE ONLY): 23 min  Charges:  $Gait Training: 8-22 mins                    G Codes:      Marcene Brawn 03/31/2015, 10:09 AM   Lewis Shock, PT, DPT Pager #: [phone] Office #: [phone]

## 2015-03-31 NOTE — Telephone Encounter (Signed)
Ok, please notify Shanda BumpsJessica.   These can certainly be ordered with the home health orders at hospital discharge.

## 2015-03-31 NOTE — Progress Notes (Signed)
Occupational Therapy Treatment Patient Details Name: Bridget Cisneros MRN: [id number] DOB: 1916-09-26 Today's Date: 03/31/2015    History of present illness Adm 12/26 with N/V/abd pain, CT abd negative  PMHx- dementia, FTT, CKD, HTN   OT comments  Pt making significant progress towards OT goals. Pt required min-mod assist for transfers and hand-under-hand/setup assist for UB ADLs and max assist for LB ADLs. Pt is easily distracted, but does well with being handed familiar objects (utensils, comb, etc...) and can complete tasks without cues. Recommending HHOT since pt has made significant progress since last OT session.    Follow Up Recommendations  Home health OT;Supervision/Assistance - 24 hour    Equipment Recommendations  3 in 1 bedside comode;Other (RW-2 wheeled) (no family at home to determine if pt has any DME)    Recommendations for Other Services      Precautions / Restrictions Precautions Precautions: Fall Precaution Comments: dementia Restrictions Weight Bearing Restrictions: No       Mobility Bed Mobility Overal bed mobility: Needs Assistance Bed Mobility: Sit to Supine       Sit to supine: Min guard   General bed mobility comments: Min guard assist for safety. HOB flat, no use of bedrails. Pt also able to slide up in the bed with just verbal cues  Transfers Overall transfer level: Needs assistance Equipment used: Rolling walker (2 wheeled) Transfers: Sit to/from Stand Sit to Stand: Mod assist         General transfer comment: Mod assist for sit-stand transfer off of low toilet - min assist for all other transfers. Verbal cues for safe hand placement, RW use, and sequencing due to being easily distracted    Balance Overall balance assessment: Needs assistance Sitting-balance support: No upper extremity supported;Feet supported Sitting balance-Leahy Scale: Fair     Standing balance support: Bilateral upper extremity supported;During functional  activity Standing balance-Leahy Scale: Poor Standing balance comment: Reliance on RW for UE support throughout entire session                   ADL Overall ADL's : Needs assistance/impaired Eating/Feeding: Set up;Sitting Eating/Feeding Details (indicate cue type and reason): Ate a few bites of pie and sipped on sweet tea Grooming: Wash/dry hands;Wash/dry face;Set up;Sitting;Cueing for sequencing   Upper Body Bathing: Set up;Cueing for sequencing;Sitting   Lower Body Bathing: Moderate assistance;Cueing for sequencing;Sitting/lateral leans   Upper Body Dressing : Set up;Sitting   Lower Body Dressing: Maximal assistance;Cueing for sequencing;Sitting/lateral leans   Toilet Transfer: Minimal assistance;Ambulation;Regular Toilet;RW   Toileting- Clothing Manipulation and Hygiene: Maximal assistance;Cueing for sequencing;Sit to/from stand       Functional mobility during ADLs: Minimal assistance;Rolling walker General ADL Comments: Pt completed some ADLs with setup and required max assist for LB ADLs but did attempt to assist throughout. Pt needed lots of hand-under-hand and verbal cues to complete sequence of tasks and was easily distracted. Pt did well with following commands although needed to be repeated several times due to [facility].      Vision                 Additional Comments: Unable to complete due to cognitive deficits and pt not understanding these kinds of commands.   Perception     Praxis      Cognition   Behavior During Therapy: WFL for tasks assessed/performed Overall Cognitive Status: History of cognitive impairments - at baseline  Extremity/Trunk Assessment               Exercises     Shoulder Instructions       General Comments      Pertinent Vitals/ Pain       Pain Assessment: No/denies pain  Home Living                                          Prior Functioning/Environment               Frequency Min 2X/week     Progress Toward Goals  OT Goals(current goals can now be found in the care plan section)  Progress towards OT goals: Progressing toward goals  Acute Rehab OT Goals Patient Stated Goal: unable to state OT Goal Formulation: Patient unable to participate in goal setting Time For Goal Achievement: 04/13/15 Potential to Achieve Goals: Fair ADL Goals Pt Will Perform Grooming: with min assist;sitting Pt Will Perform Upper Body Bathing: with min assist;sitting Pt Will Perform Lower Body Bathing: sit to/from stand;with mod assist Pt Will Perform Upper Body Dressing: with min assist;sitting Pt Will Perform Lower Body Dressing: with mod assist;sit to/from stand Pt Will Transfer to Toilet: with min assist;ambulating;bedside commode Additional ADL Goal #1: Pt will follow one step commands consistently with mod verbal cueing  Plan Discharge plan needs to be updated    Co-evaluation                 End of Session Equipment Utilized During Treatment: Gait belt;Rolling walker   Activity Tolerance Patient tolerated treatment well   Patient Left in bed;with call bell/phone within reach;with bed alarm set   Nurse Communication Mobility status;Other (comment) (Pt voided bladder and bowel on commode)        Time: [id number] OT Time Calculation (min): 30 min  Charges:    Nils PyleJulia Geordan Xu, OTR/L 03/31/2015, 1:43 PM Pager: [phone]

## 2015-03-31 NOTE — Progress Notes (Signed)
Patient is being transported back to private residence where she was residing, she is back to her baseline which is not  competent so [facility] is not able [facility]. She will be transported to the residence all belongings were collected and given to transport, IV removed.

## 2015-04-02 DIAGNOSIS — G301 Alzheimer's disease with late onset: Secondary | ICD-10-CM | POA: Diagnosis not present

## 2015-04-02 DIAGNOSIS — I129 Hypertensive chronic kidney disease with stage 1 through stage 4 chronic kidney disease, or unspecified chronic kidney disease: Secondary | ICD-10-CM | POA: Diagnosis not present

## 2015-04-02 DIAGNOSIS — N183 Chronic kidney disease, stage 3 (moderate): Secondary | ICD-10-CM | POA: Diagnosis not present

## 2015-04-02 DIAGNOSIS — M6281 Muscle weakness (generalized): Secondary | ICD-10-CM | POA: Diagnosis not present

## 2015-04-04 ENCOUNTER — Emergency Department (HOSPITAL_COMMUNITY): Payer: Medicare Other

## 2015-04-04 ENCOUNTER — Encounter (HOSPITAL_COMMUNITY): Payer: Self-pay

## 2015-04-04 ENCOUNTER — Emergency Department (HOSPITAL_COMMUNITY)
Admission: EM | Admit: 2015-04-04 | Discharge: 2015-04-04 | Disposition: A | Discharge status: Home / Self Care | Payer: Medicare Other | Attending: Emergency Medicine | Admitting: Emergency Medicine

## 2015-04-04 DIAGNOSIS — Z79899 Other long term (current) drug therapy: Secondary | ICD-10-CM | POA: Diagnosis not present

## 2015-04-04 DIAGNOSIS — I10 Essential (primary) hypertension: Secondary | ICD-10-CM | POA: Insufficient documentation

## 2015-04-04 DIAGNOSIS — E860 Dehydration: Secondary | ICD-10-CM | POA: Diagnosis not present

## 2015-04-04 DIAGNOSIS — R011 Cardiac murmur, unspecified: Secondary | ICD-10-CM | POA: Insufficient documentation

## 2015-04-04 DIAGNOSIS — G309 Alzheimer's disease, unspecified: Secondary | ICD-10-CM | POA: Diagnosis not present

## 2015-04-04 DIAGNOSIS — R339 Retention of urine, unspecified: Secondary | ICD-10-CM | POA: Diagnosis not present

## 2015-04-04 DIAGNOSIS — F0280 Dementia in other diseases classified elsewhere without behavioral disturbance: Secondary | ICD-10-CM | POA: Insufficient documentation

## 2015-04-04 DIAGNOSIS — R531 Weakness: Secondary | ICD-10-CM | POA: Insufficient documentation

## 2015-04-04 DIAGNOSIS — R627 Adult failure to thrive: Secondary | ICD-10-CM | POA: Diagnosis present

## 2015-04-04 LAB — URINALYSIS, ROUTINE W REFLEX MICROSCOPIC
Bilirubin Urine: NEGATIVE
GLUCOSE, UA: NEGATIVE mg/dL
HGB URINE DIPSTICK: NEGATIVE
Ketones, ur: NEGATIVE mg/dL
Nitrite: NEGATIVE
PH: 5 (ref 5.0–8.0)
PROTEIN: NEGATIVE mg/dL
Specific Gravity, Urine: 1.017 (ref 1.005–1.030)

## 2015-04-04 LAB — COMPREHENSIVE METABOLIC PANEL
ALBUMIN: 3.2 g/dL — AB (ref 3.5–5.0)
ALK PHOS: 96 U/L (ref 38–126)
ALT: 30 U/L (ref 14–54)
AST: 37 U/L (ref 15–41)
Anion gap: 12 (ref 5–15)
BILIRUBIN TOTAL: 0.6 mg/dL (ref 0.3–1.2)
BUN: 38 mg/dL — AB (ref 6–20)
CALCIUM: 9.6 mg/dL (ref 8.9–10.3)
CO2: 23 mmol/L (ref 22–32)
Chloride: 104 mmol/L (ref 101–111)
Creatinine, Ser: 1.48 mg/dL — ABNORMAL HIGH (ref 0.44–1.00)
GFR calc Af Amer: 33 mL/min — ABNORMAL LOW (ref >60)
GFR calc non Af Amer: 29 mL/min — ABNORMAL LOW (ref >60)
GLUCOSE: 104 mg/dL — AB (ref 65–99)
POTASSIUM: 4.2 mmol/L (ref 3.5–5.1)
SODIUM: 139 mmol/L (ref 135–145)
TOTAL PROTEIN: 7.8 g/dL (ref 6.5–8.1)

## 2015-04-04 LAB — CBC WITH DIFFERENTIAL/PLATELET
BASOS ABS: 0 10*3/uL (ref 0.0–0.1)
BASOS PCT: 0 %
Eosinophils Absolute: 0.2 10*3/uL (ref 0.0–0.7)
Eosinophils Relative: 2 %
HEMATOCRIT: 38.1 % (ref 36.0–46.0)
HEMOGLOBIN: 12 g/dL (ref 12.0–15.0)
Lymphocytes Relative: 17 %
Lymphs Abs: 2 10*3/uL (ref 0.7–4.0)
MCH: 26.8 pg (ref 26.0–34.0)
MCHC: 31.5 g/dL (ref 30.0–36.0)
MCV: 85.2 fL (ref 78.0–100.0)
Monocytes Absolute: 0.5 10*3/uL (ref 0.1–1.0)
Monocytes Relative: 5 %
NEUTROS ABS: 8.8 10*3/uL — AB (ref 1.7–7.7)
NEUTROS PCT: 76 %
Platelets: 538 10*3/uL — ABNORMAL HIGH (ref 150–400)
RBC: 4.47 MIL/uL (ref 3.87–5.11)
RDW: 13.4 % (ref 11.5–15.5)
WBC: 11.6 10*3/uL — AB (ref 4.0–10.5)

## 2015-04-04 LAB — URINE MICROSCOPIC-ADD ON: RBC / HPF: NONE SEEN RBC/hpf (ref 0–5)

## 2015-04-04 LAB — I-STAT TROPONIN, ED: Troponin i, poc: 0.01 ng/mL (ref 0.00–0.08)

## 2015-04-04 LAB — I-STAT CG4 LACTIC ACID, ED: Lactic Acid, Venous: 1.32 mmol/L (ref 0.5–2.0)

## 2015-04-04 NOTE — Discharge Instructions (Signed)
You were seen today for your weakness as well as here and inability to urinate. You are making urine but for some reason you are not able to urinate. Foley catheter was placed to help facilitate continued bladder. At this time your urine does not appear infected. You need to be reevaluated by a urologist in about one week. Follow-up with your primary care physician regarding her ongoing weakness and poor appetite.  Acute Urinary Retention, Female Acute urinary retention is the temporary inability to urinate. This is an uncommon problem in women. It can be caused by:  Infection.  A side effect of a medicine.  A problem in a nearby organ that presses or squeezes on the bladder or the urethra (the tube that drains the bladder).  Psychological problems.   Surgery on your bladder, urethra, or pelvic organs that causes obstruction to the outflow of urine from your bladder. HOME CARE INSTRUCTIONS  If you are sent home with a Foley catheter and a drainage system, you will need to discuss the best course of action with your health care provider. While the catheter is in, maintain a good intake of fluids. Keep the drainage bag emptied and lower than your catheter. This is so that contaminated urine will not flow back into your bladder, which could lead to a urinary tract infection. There are two main types of drainage bags. One is a large bag that usually is used at night. It has a good capacity that will allow you to sleep through the night without having to empty it. The second type is called a leg bag. It has a smaller capacity so it needs to be emptied more frequently. However, the main advantage is that it can be attached by a leg strap and goes underneath your clothing, allowing you the freedom to move about or leave your home. Only take over-the-counter or prescription medicines for pain, discomfort, or fever as directed by your health care provider.  SEEK MEDICAL CARE IF: 1. You develop a low-grade  fever. 2. You experience spasms or leakage of urine with the spasms. SEEK IMMEDIATE MEDICAL CARE IF:  1. You develop chills or fever. 2. Your catheter stops draining urine. 3. Your catheter falls out. 4. You start to develop increased bleeding that does not respond to rest and increased fluid intake. MAKE SURE YOU: 1. Understand these instructions. 2. Will watch your condition. 3. Will get help right away if you are not doing well or get worse.   This information is not intended to replace advice given to you by your health care provider. Make sure you discuss any questions you have with your health care provider.   Document Released: 03/18/2006 Document Revised: 08/03/2014 Document Reviewed: 08/28/2012 Elsevier Interactive Patient Education 2016 [employer]   Foley Catheter Care, Adult A Foley catheter is a soft, flexible tube that is placed into the bladder to drain urine. A Foley catheter may be inserted if:  You leak urine or are not able to control when you urinate (urinary incontinence).  You are not able to urinate when you need to (urinary retention).  You had prostate surgery or surgery on the genitals.  You have certain medical conditions, such as multiple sclerosis, dementia, or a spinal cord injury. If you are going home with a Foley catheter in place, follow the instructions below. TAKING CARE OF THE CATHETER 3. Wash your hands with soap and water. 4. Using mild soap and warm water on a clean washcloth:  Clean the  area on your body closest to the catheter insertion site using a circular motion, moving away from the catheter. Never wipe toward the catheter because this could sweep bacteria up into the urethra and cause infection.  Remove all traces of soap. Pat the area dry with a clean towel. For males, reposition the foreskin. 5. Attach the catheter to your leg so there is no tension on the catheter. Use adhesive tape or a leg strap. If you are using adhesive  tape, remove any sticky residue left behind by the previous tape you used. 6. Keep the drainage bag below the level of the bladder, but keep it off the floor. 7. Check throughout the day to be sure the catheter is working and urine is draining freely. Make sure the tubing does not become kinked. 8. Do not pull on the catheter or try to remove it. Pulling could damage internal tissues. TAKING CARE OF THE DRAINAGE BAGS You will be given two drainage bags to take home. One is a large overnight drainage bag, and the other is a smaller leg bag that fits underneath clothing. You may wear the overnight bag at any time, but you should never wear the smaller leg bag at night. Follow the instructions below for how to empty, change, and clean your drainage bags. Emptying the Drainage Bag You must empty your drainage bag when it is  - full or at least 2-3 times a day. 5. Wash your hands with soap and water. 6. Keep the drainage bag below your hips, below the level of your bladder. This stops urine from going back into the tubing and into your bladder. 7. Hold the dirty bag over the toilet or a clean container. 8. Open the pour spout at the bottom of the bag and empty the urine into the toilet or container. Do not let the pour spout touch the toilet, container, or any other surface. Doing so can place bacteria on the bag, which can cause an infection. 9. Clean the pour spout with a gauze pad or cotton ball that has rubbing alcohol on it. 10. Close the pour spout. 11. Attach the bag to your leg with adhesive tape or a leg strap. 12. Wash your hands well. Changing the Drainage Bag Change your drainage bag once a month or sooner if it starts to smell bad or look dirty. Below are steps to follow when changing the drainage bag. 4. Wash your hands with soap and water. 5. Pinch off the rubber catheter so that urine does not spill out. 6. Disconnect the catheter tube from the drainage tube at the connection valve.  Do not let the tubes touch any surface. 7. Clean the end of the catheter tube with an alcohol wipe. Use a different alcohol wipe to clean the end of the drainage tube. 8. Connect the catheter tube to the drainage tube of the clean drainage bag. 9. Attach the new bag to the leg with adhesive tape or a leg strap. Avoid attaching the new bag too tightly. 10. Wash your hands well. Cleaning the Drainage Bag 1. Wash your hands with soap and water. 2. Wash the bag in warm, soapy water. 3. Rinse the bag thoroughly with warm water. 4. Fill the bag with a solution of white vinegar and water (1 cup vinegar to 1 qt warm water [.2 L vinegar to 1 L warm water]). Close the bag and soak it for 30 minutes in the solution. 5. Rinse the bag with warm water. 6.  Hang the bag to dry with the pour spout open and hanging downward. 7. Store the clean bag (once it is dry) in a clean plastic bag. 8. Wash your hands well. PREVENTING INFECTION  Wash your hands before and after handling your catheter.  Take showers daily and wash the area where the catheter enters your body. Do not take baths. Replace wet leg straps with dry ones, if this applies.  Do not use powders, sprays, or lotions on the genital area. Only use creams, lotions, or ointments as directed by your caregiver.  For females, wipe from front to back after each bowel movement.  Drink enough fluids to keep your urine clear or pale yellow unless you have a fluid restriction.  Do not let the drainage bag or tubing touch or lie on the floor.  Wear cotton underwear to absorb moisture and to keep your skin drier. SEEK MEDICAL CARE IF:   Your urine is cloudy or smells unusually bad.  Your catheter becomes clogged.  You are not draining urine into the bag or your bladder feels full.  Your catheter starts to leak. SEEK IMMEDIATE MEDICAL CARE IF:   You have pain, swelling, redness, or pus where the catheter enters the body.  You have pain in the  abdomen, legs, lower back, or bladder.  You have a fever.  You see blood fill the catheter, or your urine is pink or red.  You have nausea, vomiting, or chills.  Your catheter gets pulled out. MAKE SURE YOU:   Understand these instructions.  Will watch your condition.  Will get help right away if you are not doing well or get worse.   This information is not intended to replace advice given to you by your health care provider. Make sure you discuss any questions you have with your health care provider.   Document Released: 03/19/2005 Document Revised: 08/03/2013 Document Reviewed: 03/10/2012 Elsevier Interactive Patient Education [employer]

## 2015-04-04 NOTE — ED Notes (Signed)
[facility] Expected date:  Expected time:  Means of arrival:  Comments: EMS-trouble urinating

## 2015-04-04 NOTE — ED Provider Notes (Signed)
CSN: [id number]     Arrival date & time 04/04/15  1227 History   First MD Initiated Contact with Patient 04/04/15 1242     Chief Complaint  Patient presents with  . Urinary Problems    . Dehydration  . Failure To Thrive     (Consider location/radiation/quality/duration/timing/severity/associated sxs/prior Treatment) HPI Comments: 82 year old female with history of dementia, generalized weakness, chronic malnutrition presents with her family for not urinating as well as poor by mouth intake. The family reports that the patient has not had a wet depends since at least 36 hours ago. They report that the patient has been drinking and sure but that is all that she will take orally. Today the physical therapist came to the house and the patient was not able to participate. The family was concerned that the patient may be dehydrated because this has happened to her in the past and when she came to the hospital they found that her blood showed that she was dehydrated at that time. She has not had a cough or fever. No nausea or vomiting.   Past Medical History  Diagnosis Date  . Alzheimer's disease   . Hypertension    History reviewed. No pertinent past surgical history. No family history on file. Social History  Substance Use Topics  . Smoking status: Never Smoker   . Smokeless tobacco: None  . Alcohol Use: No   OB History    Gravida Para Term Preterm AB TAB SAB Ectopic Multiple Living            2     Review of Systems  Unable to perform ROS: Dementia      Allergies  Review of patient's allergies indicates no known allergies.  Home Medications   Prior to Admission medications   Medication Sig Start Date End Date Taking? Authorizing Provider  AMBULATORY NON FORMULARY MEDICATION Depends Size Large Dx: 585.3 and 331.0 12/22/13  Yes Mahima Pandey, MD  amLODipine (NORVASC) 10 MG tablet TAKE ONE TABLET BY MOUTH ONCE DAILY FOR BLOOD PRESSURE 02/15/15  Yes Sharon SellerJessica K Eubanks, NP   feeding supplement, ENSURE ENLIVE, (ENSURE ENLIVE) LIQD Take 237 mLs by mouth 2 (two) times daily after a meal. 03/30/15  Yes Nishant Dhungel, MD  hydrochlorothiazide (HYDRODIURIL) 25 MG tablet Take 25 mg by mouth daily. 02/15/15  Yes Historical Provider, MD   BP 145/82 mmHg  Pulse 74  Temp(Src) 98.4 F (36.9 C) (Oral)  Resp 18  SpO2 100% Physical Exam  Constitutional: No distress.  HENT:  Head: Normocephalic and atraumatic.  Right Ear: External ear normal.  Left Ear: External ear normal.  Mouth/Throat: Oropharynx is clear and moist. No oropharyngeal exudate.  Eyes: EOM are normal. Pupils are equal, round, and reactive to light.  Neck: Normal range of motion. Neck supple.  Cardiovascular: Normal rate and intact distal pulses.   Murmur heard. Pulmonary/Chest: Effort normal. No respiratory distress. She has no wheezes. She has no rales.  Abdominal: Soft. She exhibits no distension. There is no tenderness. There is no rebound and no guarding.  Musculoskeletal: Normal range of motion. She exhibits no edema or tenderness.  Neurological: She is alert. She exhibits normal muscle tone.  Oriented to self  Skin: Skin is warm and dry. She is not diaphoretic.  Vitals reviewed.   ED Course  Procedures (including critical care time) Labs Review Labs Reviewed  CBC WITH DIFFERENTIAL/PLATELET - Abnormal; Notable for the following:    WBC 11.6 (*)    Platelets 538 (*)  Neutro Abs 8.8 (*)    All other components within normal limits  COMPREHENSIVE METABOLIC PANEL - Abnormal; Notable for the following:    Glucose, Bld 104 (*)    BUN 38 (*)    Creatinine, Ser 1.48 (*)    Albumin 3.2 (*)    GFR calc non Af Amer 29 (*)    GFR calc Af Amer 33 (*)    All other components within normal limits  URINALYSIS, ROUTINE W REFLEX MICROSCOPIC (NOT AT [facility]) - Abnormal; Notable for the following:    Color, Urine AMBER (*)    APPearance CLOUDY (*)    Leukocytes, UA SMALL (*)    All other components  within normal limits  URINE MICROSCOPIC-ADD ON - Abnormal; Notable for the following:    Squamous Epithelial / LPF 0-5 (*)    Bacteria, UA FEW (*)    All other components within normal limits  URINE CULTURE  I-STAT CG4 LACTIC ACID, ED  I-STAT TROPOININ, ED  I-STAT CG4 LACTIC ACID, ED    Imaging Review Dg Abd Acute W/chest  04/04/2015  CLINICAL DATA:  Altered mental status. Failure to thrive. Loss of appetite. EXAM: DG ABDOMEN ACUTE W/ 1V CHEST Decubitus film substituted for upright radiograph. COMPARISON:  03/28/2015. FINDINGS: Kyphosis results in partial obscure a shin of the apices similar to priors. Cardiomegaly.  Low lung volumes.  No active infiltrates or failure. Nonobstructive gas pattern. Degenerative change lumbar spine. No free air. No abnormal calcifications. Moderate stool burden. IMPRESSION: Negative abdominal radiographs. No acute cardiopulmonary disease. Cardiomegaly. Electronically Signed   By: Elsie Stain M.D.   On: 04/04/2015 14:22   I have personally reviewed and evaluated these images and lab results as part of my medical decision-making.   EKG Interpretation   Date/Time:  Monday April 04 2015 13:30:42 EST Ventricular Rate:  81 PR Interval:  227 QRS Duration: 134 QT Interval:  382 QTC Calculation: 443 R Axis:   52 Text Interpretation:  Sinus rhythm Prolonged PR interval Left bundle  branch block Baseline wander in lead(s) I aVR No significant change since  last tracing Confirmed by NGUYEN, EMILY ([id number]) on 04/04/2015 1:40:54 PM      MDM  Patient was seen and evaluated in stable condition. Labs were unremarkable for patient including her creatinine which is chronically elevated but is stable compared to previous at time of discharge. Patient was found to have a large amount of urine in her bladder on bladder scan was over 600 mL.  Foley catheter was placed which was consistent with that amount of urine. UA did not appear consistent with UTI. Culture was sent.  Patient here with multiple chronic issues that she has been admitted for in the past and for which she is supposed to be following up with her primary care physician. Her granddaughter is also her caregiver at bedside felt comfortable caring for the patient at home. She reported that they are D arranged a follow-up appointment with her primary care physician. She was discharged home in stable condition with strict return precautions and instructions to follow-up with urology outpatient.  All questions were answered prior to discharge. Final diagnoses:  Urinary retention    1. Urinary retention  2. Chronic weakness    Leta Baptist, MD 04/04/15 [phone]

## 2015-04-04 NOTE — ED Notes (Signed)
Bed: WA25 Expected date:  Expected time:  Means of arrival:  Comments: Hall c 

## 2015-04-04 NOTE — Progress Notes (Addendum)
Pt caregiver given instructions on how to drain foley bag. Cm encouraged caregiver not to elevate the foley bag above the pt CM provided a urinal for home use to drain foley. CM provided 2 incontinence pads and discussed purchasing bulk at [employer], [employer] or inquiring about pads from [facility] ([facility] with [facility], HHRN, HHPT) or DME agency CM used teach back method and care giver drained the second half of the foley During this time Cm pointed out prevention of kinking of foley tubing when clamping to prevent back flow of urine leading to infections  Foley emptied of 600 ml of dark amber urine no particles  Care giver voiced concern about pt pulling out foley Cm discussed this with ED RN  ED RN updated

## 2015-04-04 NOTE — ED Notes (Signed)
Per [facility]- DNR YELLOW COPY PRESENT. Pt resides at home. [facility]. D/c'd. Pt last void was yesterday. Family states not eating or drinking for several days. Pt able to take medications. PT at house when EMS arrived.

## 2015-04-05 LAB — URINE CULTURE: Culture: NO GROWTH

## 2015-04-07 ENCOUNTER — Telehealth: Payer: Self-pay | Admitting: *Deleted

## 2015-04-07 NOTE — Telephone Encounter (Signed)
Bridget HatchetSheila with [facility] called and stated that she needed verbal orders for Social Worker for patient. Verbal order given. Confirmed appointment with Nurse for the 04/12/15

## 2015-04-11 ENCOUNTER — Telehealth: Payer: Self-pay | Admitting: *Deleted

## 2015-04-11 NOTE — Telephone Encounter (Signed)
Noted.  Will await urine culture results.

## 2015-04-11 NOTE — Telephone Encounter (Signed)
Megan with [facility] called and stated that patient has Symptoms of UTI, Sediment and urine cloudy with odor with a pink tinge to it. Nurse called and spoke with on call provider, Edmon CrapeArlo Lassen and obtained a verbal order but he wanted her to call office just to make you aware of what was going on.

## 2015-04-12 ENCOUNTER — Ambulatory Visit: Payer: Medicare Other | Admitting: Nurse Practitioner

## 2015-04-13 ENCOUNTER — Telehealth: Payer: Self-pay | Admitting: *Deleted

## 2015-04-13 MED ORDER — SULFAMETHOXAZOLE-TRIMETHOPRIM 800-160 MG PO TABS: ORAL_TABLET | ORAL | Status: DC

## 2015-04-13 MED ORDER — BACID PO TABS: ORAL_TABLET | ORAL | Status: DC

## 2015-04-13 NOTE — Telephone Encounter (Signed)
Dr. Montez Moritaarter reviewed the U/A results--Bactrim DS Take one tablet by mouth twice daily for 3 days for infection. NO RF. #6tabs. Take Probiotic daily while on antibiotic [facility] and [facility] agreed and will call patient's caregiver and informed them with the instructions.  Rx faxed to pharmacy.

## 2015-04-13 NOTE — Addendum Note (Signed)
Addended by: Nelda SevereMAY, ANITA A on: 04/13/2015 04:08 PM   Modules accepted: Orders

## 2015-04-13 NOTE — Telephone Encounter (Signed)
Megan with [facility] called and wanted to know if we received U/A culture and wanted verbal orders to continue therapy. We have received urine culture and given to Dr. Montez Moritaarter to review and sign. Verbal order to continue therapy given.

## 2015-04-19 ENCOUNTER — Ambulatory Visit: Payer: Medicare Other | Admitting: Nurse Practitioner

## 2015-04-19 ENCOUNTER — Telehealth: Payer: Self-pay | Admitting: *Deleted

## 2015-04-19 NOTE — Telephone Encounter (Signed)
Patient granddaughter called and stated that [facility] was going to fax [place] paperwork to have filled out for placement of patient at [facility] or [facility]. Reviewed patient's chart and informed granddaughter that patient would need an appointment to fill out FL2 forms due to not seeing patient since 06/2014. Granddaughter stated that she will call back and go ahead and make appointment when she could line up transportation. Patient did have an appointment for today for Hospital Follow up but canceled with [facility] stating that she had no form of transportation.

## 2015-04-20 ENCOUNTER — Telehealth: Payer: Self-pay | Admitting: *Deleted

## 2015-04-20 NOTE — Telephone Encounter (Signed)
Received FL2 form from [place] with [facility] [phone]. Would like for Dr. Renato Gails to review and sign so they can place patient in SNF/Rehab at [facility] or [facility]. I did talk with Grand daughter yesterday and informed her that patient may have to be seen first to fill out due to not being in since 3/16. Given to Dr. Renato Gails to review.

## 2015-04-21 ENCOUNTER — Ambulatory Visit (INDEPENDENT_AMBULATORY_CARE_PROVIDER_SITE_OTHER): Payer: Medicare Other | Admitting: Nurse Practitioner

## 2015-04-21 ENCOUNTER — Encounter: Payer: Self-pay | Admitting: Nurse Practitioner

## 2015-04-21 VITALS — BP 112/72 | HR 83 | Temp 97.4°F | Resp 18

## 2015-04-21 DIAGNOSIS — G301 Alzheimer's disease with late onset: Secondary | ICD-10-CM

## 2015-04-21 DIAGNOSIS — R627 Adult failure to thrive: Secondary | ICD-10-CM

## 2015-04-21 DIAGNOSIS — N183 Chronic kidney disease, stage 3 unspecified: Secondary | ICD-10-CM

## 2015-04-21 DIAGNOSIS — F0281 Dementia in other diseases classified elsewhere with behavioral disturbance: Secondary | ICD-10-CM | POA: Diagnosis not present

## 2015-04-21 DIAGNOSIS — I10 Essential (primary) hypertension: Secondary | ICD-10-CM

## 2015-04-21 DIAGNOSIS — F02818 Dementia in other diseases classified elsewhere, unspecified severity, with other behavioral disturbance: Secondary | ICD-10-CM

## 2015-04-21 NOTE — Progress Notes (Signed)
Patient ID: Bridget Cisneros, female   DOB: [birth year], 82 y.o.   MRN: [id number]    PCP: Sharon Seller, NP   No Known Allergies  Chief Complaint  Patient presents with  . Medical Management of Chronic Issues    FL2 form filled out     HPI: Patient is a 82 y.o. female seen in the office today for FL2. Pt with pmh of dementia, HTN, CKD stage 3, malnutrition.  Has home health therapy following at home. Pt with foley now due to urinary retention.  Therapy has signed off.  Decreased appetite, minimal PO intake.  Using barrier cream to buttock to help prevent breakdown Requiring increase assistance.   Review of Systems:  Review of Systems  Unable to perform ROS: Dementia    Past Medical History  Diagnosis Date  . Alzheimer's disease   . Hypertension    No past surgical history on file. Social History:   reports that she has never smoked. She does not have any smokeless tobacco history on file. She reports that she does not drink alcohol or use illicit drugs.  No family history on file.  Medications: Patient's Medications  New Prescriptions   No medications on file  Previous Medications   AMBULATORY NON FORMULARY MEDICATION    Depends Size Large Dx: 585.3 and 331.0   AMLODIPINE (NORVASC) 10 MG TABLET    TAKE ONE TABLET BY MOUTH ONCE DAILY FOR BLOOD PRESSURE   FEEDING SUPPLEMENT, ENSURE ENLIVE, (ENSURE ENLIVE) LIQD    Take 237 mLs by mouth 2 (two) times daily after a meal.   HYDROCHLOROTHIAZIDE (HYDRODIURIL) 25 MG TABLET    Take 25 mg by mouth daily.   LACTOBACILLUS ACIDOPHILUS (BACID) TABS TABLET    Probiotic by mouth daily while on antibiotic  Modified Medications   No medications on file  Discontinued Medications   SULFAMETHOXAZOLE-TRIMETHOPRIM (BACTRIM DS,SEPTRA DS) 800-160 MG TABLET    Take one tablet by mouth twice daily for 3 days for infection     Physical Exam:  Filed Vitals:   04/21/15 1524  BP: 112/72  Pulse: 83  Temp: 97.4 F (36.3 C)  Resp: 18    SpO2: 99%   There is no weight on file to calculate BMI.  Physical Exam  Constitutional: She appears well-developed and well-nourished. No distress.  HENT:  Head: Normocephalic and atraumatic.  Nose: Nose normal.  Mouth/Throat: Oropharynx is clear and moist.  Eyes: Conjunctivae and EOM are normal. Pupils are equal, round, and reactive to light.  Neck: Neck supple.  Cardiovascular: Normal rate, regular rhythm and normal heart sounds.   Pulmonary/Chest: Effort normal and breath sounds normal. No respiratory distress. Right breast exhibits no inverted nipple, no mass, no nipple discharge, no skin change and no tenderness. Left breast exhibits no inverted nipple, no mass, no nipple discharge, no skin change and no tenderness.  Abdominal: Soft. Bowel sounds are normal. She exhibits no distension.  Musculoskeletal:  In wheel chair, requiring increase assist for all transfers   Neurological: She is alert.  Skin: Skin is warm.  Psychiatric: Cognition and memory are impaired. She exhibits abnormal recent memory and abnormal remote memory.  Agitated easily    Labs reviewed: Basic Metabolic Panel:  Recent Labs  40/98/11 0430 03/30/15 0458 04/04/15 1339  NA 139 141 139  K 4.7 4.2 4.2  CL 108 107 104  CO2 20* 22 23  GLUCOSE 112* 91 104*  BUN 52* 37* 38*  CREATININE 1.98* 1.40* 1.48*  CALCIUM 8.7*  9.0 9.6   Liver Function Tests:  Recent Labs  03/28/15 2219 03/29/15 0430 04/04/15 1339  AST 57* 71* 37  ALT 53 63* 30  ALKPHOS 109 95 96  BILITOT 1.0 1.0 0.6  PROT 7.7 7.1 7.8  ALBUMIN 3.2* 2.9* 3.2*    Recent Labs  03/28/15 2219  LIPASE 30   No results for input(s): AMMONIA in the last 8760 hours. CBC:  Recent Labs  06/03/14 0812 03/28/15 2219 03/29/15 0430 04/04/15 1339  WBC 9.7 10.6* 8.0 11.6*  NEUTROABS 5.2  --   --  8.8*  HGB 11.8 11.9* 11.5* 12.0  HCT 37.3 37.4 34.6* 38.1  MCV 80 84.2 87.2 85.2  PLT  --  486* 436* 538*   Lipid Panel:  Recent Labs   06/03/14 0812  CHOL 129  HDL 47  LDLCALC 54  TRIG 142  CHOLHDL 2.7   TSH: No results for input(s): TSH in the last 8760 hours. A1C: Lab Results  Component Value Date   HGBA1C 5.5 06/03/2014     Assessment/Plan 1. Late onset Alzheimer's disease with behavioral disturbance -increase needs that family is unable to handle at this time. FL2 form completed and looking for placement to skill facility. Reports they plan to move her as soon as possible.   2. Essential hypertension, benign Blood pressure stable, has had minimal fluid intake, wills top HCTZ at this time.  3. Chronic kidney disease (CKD), stage III (moderate) -will need ongoing follow up. To avoid NSAIDS and dehydration. HCTZ stopped.   4. FTT (failure to thrive) in adult -pt with advanced dementia now with decrease appetite and PO intake. Plan to move to SNF due to increase care needs. Expect further decline as dementia progresses    Karianna Gusman K. Biagio Borg  [facility] [phone] 8 am - 5 pm) [phone] (after hours)

## 2015-04-21 NOTE — Patient Instructions (Signed)
STOP hydrochlorothiazide

## 2015-04-22 ENCOUNTER — Encounter: Payer: Self-pay | Admitting: Internal Medicine

## 2015-05-01 ENCOUNTER — Encounter (HOSPITAL_COMMUNITY): Payer: Self-pay | Admitting: *Deleted

## 2015-05-01 ENCOUNTER — Observation Stay (HOSPITAL_COMMUNITY)
Admission: EM | Admit: 2015-05-01 | Discharge: 2015-05-05 | Disposition: A | Discharge status: Home / Self Care | Payer: Medicare Other | Attending: Internal Medicine | Admitting: Internal Medicine

## 2015-05-01 ENCOUNTER — Emergency Department (HOSPITAL_COMMUNITY): Payer: Medicare Other

## 2015-05-01 ENCOUNTER — Observation Stay (HOSPITAL_COMMUNITY): Payer: Medicare Other

## 2015-05-01 DIAGNOSIS — N179 Acute kidney failure, unspecified: Secondary | ICD-10-CM | POA: Insufficient documentation

## 2015-05-01 DIAGNOSIS — G301 Alzheimer's disease with late onset: Secondary | ICD-10-CM | POA: Diagnosis not present

## 2015-05-01 DIAGNOSIS — F0280 Dementia in other diseases classified elsewhere without behavioral disturbance: Secondary | ICD-10-CM | POA: Diagnosis present

## 2015-05-01 DIAGNOSIS — D72829 Elevated white blood cell count, unspecified: Secondary | ICD-10-CM | POA: Insufficient documentation

## 2015-05-01 DIAGNOSIS — L8990 Pressure ulcer of unspecified site, unspecified stage: Secondary | ICD-10-CM | POA: Insufficient documentation

## 2015-05-01 DIAGNOSIS — G309 Alzheimer's disease, unspecified: Secondary | ICD-10-CM | POA: Diagnosis not present

## 2015-05-01 DIAGNOSIS — N183 Chronic kidney disease, stage 3 unspecified: Secondary | ICD-10-CM | POA: Diagnosis present

## 2015-05-01 DIAGNOSIS — E870 Hyperosmolality and hypernatremia: Principal | ICD-10-CM | POA: Insufficient documentation

## 2015-05-01 DIAGNOSIS — I10 Essential (primary) hypertension: Secondary | ICD-10-CM | POA: Insufficient documentation

## 2015-05-01 DIAGNOSIS — E860 Dehydration: Secondary | ICD-10-CM | POA: Insufficient documentation

## 2015-05-01 DIAGNOSIS — R531 Weakness: Secondary | ICD-10-CM

## 2015-05-01 DIAGNOSIS — G9340 Encephalopathy, unspecified: Secondary | ICD-10-CM | POA: Insufficient documentation

## 2015-05-01 DIAGNOSIS — R4182 Altered mental status, unspecified: Secondary | ICD-10-CM | POA: Diagnosis present

## 2015-05-01 HISTORY — DX: Retention of urine, unspecified: R33.9

## 2015-05-01 LAB — CBC WITH DIFFERENTIAL/PLATELET
BASOS PCT: 0 %
Basophils Absolute: 0 10*3/uL (ref 0.0–0.1)
EOS ABS: 0 10*3/uL (ref 0.0–0.7)
Eosinophils Relative: 0 %
HCT: 40.6 % (ref 36.0–46.0)
HEMOGLOBIN: 13.2 g/dL (ref 12.0–15.0)
Lymphocytes Relative: 13 %
Lymphs Abs: 3 10*3/uL (ref 0.7–4.0)
MCH: 26.5 pg (ref 26.0–34.0)
MCHC: 32.5 g/dL (ref 30.0–36.0)
MCV: 81.5 fL (ref 78.0–100.0)
MONO ABS: 0.9 10*3/uL (ref 0.1–1.0)
MONOS PCT: 4 %
NEUTROS PCT: 83 %
Neutro Abs: 18.8 10*3/uL — ABNORMAL HIGH (ref 1.7–7.7)
PLATELETS: 413 10*3/uL — AB (ref 150–400)
RBC: 4.98 MIL/uL (ref 3.87–5.11)
RDW: 14.2 % (ref 11.5–15.5)
WBC: 22.7 10*3/uL — ABNORMAL HIGH (ref 4.0–10.5)

## 2015-05-01 LAB — BASIC METABOLIC PANEL
Anion gap: 20 — ABNORMAL HIGH (ref 5–15)
BUN: 144 mg/dL — ABNORMAL HIGH (ref 6–20)
CALCIUM: 9.9 mg/dL (ref 8.9–10.3)
CO2: 20 mmol/L — AB (ref 22–32)
CREATININE: 4.57 mg/dL — AB (ref 0.44–1.00)
Chloride: 106 mmol/L (ref 101–111)
GFR calc non Af Amer: 7 mL/min — ABNORMAL LOW (ref >60)
GFR, EST AFRICAN AMERICAN: 8 mL/min — AB (ref >60)
Glucose, Bld: 139 mg/dL — ABNORMAL HIGH (ref 65–99)
Potassium: 4.5 mmol/L (ref 3.5–5.1)
Sodium: 146 mmol/L — ABNORMAL HIGH (ref 135–145)

## 2015-05-01 LAB — I-STAT CG4 LACTIC ACID, ED: LACTIC ACID, VENOUS: 3.2 mmol/L — AB (ref 0.5–2.0)

## 2015-05-01 LAB — URINE MICROSCOPIC-ADD ON

## 2015-05-01 LAB — URINALYSIS, ROUTINE W REFLEX MICROSCOPIC
Glucose, UA: NEGATIVE mg/dL
KETONES UR: 15 mg/dL — AB
NITRITE: NEGATIVE
PH: 5 (ref 5.0–8.0)
Protein, ur: NEGATIVE mg/dL
Specific Gravity, Urine: 1.017 (ref 1.005–1.030)

## 2015-05-01 MED ORDER — ACETAMINOPHEN 650 MG RE SUPP: 650.0000 mg | Freq: Four times a day (QID) | RECTAL | Status: DC | PRN

## 2015-05-01 MED ORDER — ONDANSETRON HCL 4 MG PO TABS: 4.0000 mg | ORAL_TABLET | Freq: Four times a day (QID) | ORAL | Status: DC | PRN

## 2015-05-01 MED ORDER — DEXTROSE 5 % IV SOLN
1.0000 g | Freq: Once | INTRAVENOUS | Status: AC
  Administered 2015-05-01: 1 g via INTRAVENOUS
  Filled 2015-05-01: qty 10

## 2015-05-01 MED ORDER — AMLODIPINE BESYLATE 10 MG PO TABS
10.0000 mg | ORAL_TABLET | Freq: Every day | ORAL | Status: DC
  Administered 2015-05-03 – 2015-05-04 (×2): 10 mg via ORAL
  Filled 2015-05-01 (×2): qty 1

## 2015-05-01 MED ORDER — SODIUM CHLORIDE 0.9 % IV BOLUS (SEPSIS)
1000.0000 mL | Freq: Once | INTRAVENOUS | Status: AC
  Administered 2015-05-01: 1000 mL via INTRAVENOUS

## 2015-05-01 MED ORDER — ONDANSETRON HCL 4 MG/2ML IJ SOLN: 4.0000 mg | Freq: Four times a day (QID) | INTRAMUSCULAR | Status: DC | PRN

## 2015-05-01 MED ORDER — HEPARIN SODIUM (PORCINE) 5000 UNIT/ML IJ SOLN
5000.0000 [IU] | Freq: Three times a day (TID) | INTRAMUSCULAR | Status: DC
  Administered 2015-05-02 – 2015-05-05 (×8): 5000 [IU] via SUBCUTANEOUS
  Filled 2015-05-01 (×8): qty 1

## 2015-05-01 MED ORDER — SODIUM CHLORIDE 0.9 % IV SOLN
INTRAVENOUS | Status: AC
  Administered 2015-05-01: 22:00:00 via INTRAVENOUS

## 2015-05-01 MED ORDER — ACETAMINOPHEN 325 MG PO TABS: 650.0000 mg | ORAL_TABLET | Freq: Four times a day (QID) | ORAL | Status: DC | PRN

## 2015-05-01 MED ORDER — ENSURE ENLIVE PO LIQD
237.0000 mL | Freq: Two times a day (BID) | ORAL | Status: DC
  Administered 2015-05-03 – 2015-05-04 (×4): 237 mL via ORAL

## 2015-05-01 MED ORDER — HYDROCODONE-ACETAMINOPHEN 5-325 MG PO TABS
1.0000 | ORAL_TABLET | ORAL | Status: DC | PRN
  Administered 2015-05-05: 1 via ORAL
  Filled 2015-05-01: qty 1
  Filled 2015-05-01: qty 2

## 2015-05-01 NOTE — ED Notes (Signed)
Pt given applesauce without difficulty swallowing

## 2015-05-01 NOTE — ED Notes (Signed)
Mittens placed to prevent pt from removing catheter again.

## 2015-05-01 NOTE — H&P (Signed)
PCP: Sharon Seller, NP    Referring provider Zavitz   Chief Complaint: confusion  HPI: Bridget Cisneros is a 82 y.o. female   has a past medical history of Alzheimer's disease; Hypertension; and Urinary retention.   Presented with worsening confusion decreased by mouth intake and generalized fatigue. 3 weeks ago patient was seen at [facility] was diagnosed with urinary retention had a foley catheter placed.  Per review old records patient was awaiting placement at [facility] or [facility] patient was 19 at the office to have a full to form filled out. Patient has history of dementia. Review of records and family is no longer able to provide care for her at home given decreased by mouth intake her primary care provider has discontinued hydrochlorothiazide.  Patient's dementia has worsened she is no longer recognizes her family has been trying to pull out catheter. Family states she not been eating for few weeks. Stopped drinking ensure as well.   IN ER: Patient was empirically started on Rocephin UA appears to be turbid but no evidence of UTI though. Patient was noted to be severely dehydrated with sodium up to 146 creatinine 4.57 in BUN of 144 any in gap of 20 White blood cell count 22.7. Patient afebrile in the ER.    Regarding pertinent past history: She has history of hypertension rate the has blood pressure medication has been discontinued secondary to evidence of dehydration. She has known history of chronic kidney disease at baseline her creatinine is around 1.5  Hospitalist was called for admission for dehydration or acute on chronic renal failure  Review of Systems:    Pertinent positives include: Confusion,   Constitutional:  No weight loss, night sweats, Fevers, chills, fatigue, weight loss  HEENT:  No headaches, Difficulty swallowing,Tooth/dental problems,Sore throat,  No sneezing, itching, ear ache, nasal congestion, post nasal drip,    Cardio-vascular:  No chest pain, Orthopnea, PND, anasarca, dizziness, palpitations.no Bilateral lower extremity swelling  GI:  No heartburn, indigestion, abdominal pain, nausea, vomiting, diarrhea, change in bowel habits, loss of appetite, melena, blood in stool, hematemesis Resp:  no shortness of breath at rest. No dyspnea on exertion, No excess mucus, no productive cough, No non-productive cough, No coughing up of blood.No change in color of mucus.No wheezing. Skin:  no rash or lesions. No jaundice GU:  no dysuria, change in color of urine, no urgency or frequency. No straining to urinate.  No flank pain.  Musculoskeletal:  No joint pain or no joint swelling. No decreased range of motion. No back pain.  Psych:  No change in mood or affect. No depression or anxiety. No memory loss.  Neuro: no localizing neurological complaints, no tingling, no weakness, no double vision, no gait abnormality, no slurred speech,    Otherwise ROS are negative except for above, 10 systems were reviewed  Past Medical History: Past Medical History  Diagnosis Date  . Alzheimer's disease   . Hypertension   . Urinary retention    History reviewed. No pertinent past surgical history.   Medications: Prior to Admission medications   Medication Sig Start Date End Date Taking? Authorizing Provider  amLODipine (NORVASC) 10 MG tablet TAKE ONE TABLET BY MOUTH ONCE DAILY FOR BLOOD PRESSURE 02/15/15  Yes Sharon Seller, NP  AMBULATORY NON FORMULARY MEDICATION Depends Size Large Dx: 585.3 and 331.0 12/22/13   Oneal Grout, MD  feeding supplement, ENSURE ENLIVE, (ENSURE ENLIVE) LIQD Take 237 mLs by mouth 2 (two) times daily  after a meal. Patient not taking: Reported on 05/01/2015 03/30/15   Theda Belfast Dhungel, MD    Allergies:  No Known Allergies  Social History:  Ambulatory bed bound Lives at home With family     reports that she has never smoked. She does not have any smokeless tobacco history on file.  She reports that she does not drink alcohol or use illicit drugs.    Family History: family history includes Diabetes in her daughter; Hypertension in her daughter and son.    Physical Exam: Patient Vitals for the past 24 hrs:  BP Temp Temp src Pulse Resp SpO2  05/01/15 2000 139/68 mmHg - - - 17 -  05/01/15 1900 - - - 90 22 99 %  05/01/15 1845 - - - - 21 -  05/01/15 1830 (!) 119/105 mmHg - - - - -  05/01/15 1815 - 99.1 F (37.3 C) Rectal 83 19 97 %  05/01/15 1800 116/71 mmHg - - - 22 -    1. General:  in No Acute distress 2. Psychological: Alert not  Oriented 3. Head/ENT:     Dry Mucous Membranes appears parched                          Head Non traumatic, neck supple                           Poor Dentition 4. SKIN:  decreased Skin turgor,  Skin clean Dry and intact no rash 5. Heart: Regular rate and rhythm no Murmur, Rub or gallop 6. Lungs:distant no wheezes or crackles   7. Abdomen: Soft, non-tender, Non distended 8. Lower extremities: no clubbing, cyanosis, or edema 9. Neurologically Grossly intact, moving all 4 extremities equally 10. MSK: Normal range of motion  body mass index is unknown because there is no weight on file.   Labs on Admission:   Results for orders placed or performed during the hospital encounter of 05/01/15 (from the past 24 hour(s))  Urinalysis, Routine w reflex microscopic (not at [facility])     Status: Abnormal   Collection Time: 05/01/15  6:42 PM  Result Value Ref Range   Color, Urine AMBER (A) YELLOW   APPearance TURBID (A) CLEAR   Specific Gravity, Urine 1.017 1.005 - 1.030   pH 5.0 5.0 - 8.0   Glucose, UA NEGATIVE NEGATIVE mg/dL   Hgb urine dipstick TRACE (A) NEGATIVE   Bilirubin Urine LARGE (A) NEGATIVE   Ketones, ur 15 (A) NEGATIVE mg/dL   Protein, ur NEGATIVE NEGATIVE mg/dL   Nitrite NEGATIVE NEGATIVE   Leukocytes, UA SMALL (A) NEGATIVE  Urine microscopic-add on     Status: Abnormal   Collection Time: 05/01/15  6:42 PM  Result Value  Ref Range   Squamous Epithelial / LPF 0-5 (A) NONE SEEN   WBC, UA 0-5 0 - 5 WBC/hpf   RBC / HPF 0-5 0 - 5 RBC/hpf   Bacteria, UA RARE (A) NONE SEEN   Urine-Other AMORPHOUS URATES/PHOSPHATES   Basic metabolic panel     Status: Abnormal   Collection Time: 05/01/15  6:54 PM  Result Value Ref Range   Sodium 146 (H) 135 - 145 mmol/L   Potassium 4.5 3.5 - 5.1 mmol/L   Chloride 106 101 - 111 mmol/L   CO2 20 (L) 22 - 32 mmol/L   Glucose, Bld 139 (H) 65 - 99 mg/dL   BUN 132 (H) 6 -  20 mg/dL   Creatinine, Ser 1.91 (H) 0.44 - 1.00 mg/dL   Calcium 9.9 8.9 - 47.8 mg/dL   GFR calc non Af Amer 7 (L) >60 mL/min   GFR calc Af Amer 8 (L) >60 mL/min   Anion gap 20 (H) 5 - 15  CBC with Differential/Platelet     Status: Abnormal   Collection Time: 05/01/15  6:54 PM  Result Value Ref Range   WBC 22.7 (H) 4.0 - 10.5 K/uL   RBC 4.98 3.87 - 5.11 MIL/uL   Hemoglobin 13.2 12.0 - 15.0 g/dL   HCT 29.5 62.1 - 30.8 %   MCV 81.5 78.0 - 100.0 fL   MCH 26.5 26.0 - 34.0 pg   MCHC 32.5 30.0 - 36.0 g/dL   RDW 65.7 84.6 - 96.2 %   Platelets 413 (H) 150 - 400 K/uL   Neutrophils Relative % 83 %   Neutro Abs 18.8 (H) 1.7 - 7.7 K/uL   Lymphocytes Relative 13 %   Lymphs Abs 3.0 0.7 - 4.0 K/uL   Monocytes Relative 4 %   Monocytes Absolute 0.9 0.1 - 1.0 K/uL   Eosinophils Relative 0 %   Eosinophils Absolute 0.0 0.0 - 0.7 K/uL   Basophils Relative 0 %   Basophils Absolute 0.0 0.0 - 0.1 K/uL    UA   no evidence of UTI  Lab Results  Component Value Date   HGBA1C 5.5 06/03/2014    CrCl cannot be calculated (Unknown ideal weight.).  BNP (last 3 results) No results for input(s): PROBNP in the last 8760 hours.  Other results:  I have pearsonaly reviewed this: ECG REPORT not obtained   There were no vitals filed for this visit.   Cultures:    Component Value Date/Time   SDES URINE, CATHETERIZED 04/04/2015 1636   SPECREQUEST NONE 04/04/2015 1636   CULT  04/04/2015 1636    NO GROWTH 1 DAY Performed at  [facility]    REPTSTATUS 04/05/2015 FINAL 04/04/2015 1636     Radiological Exams on Admission: No results found.  Chart has been reviewed  Family not at  Bedside  plan of care was discussed with  [facility] on the phone [phone], Called Daughter in law Camdyn Laden ([phone], [phone]     Assessment/Plan  82 year old female strip hypertension chronic kidney disease and recently diagnosed urinary retention presents with dehydration and acute on chronic renal failure  Present on Admission:  . Essential hypertension, benign continue to hold hydrochlorothiazide  . Acute renal failure superimposed on stage 3 chronic kidney disease (HCC) - most likely secondary to dehydration  . Alzheimer's disease anticipate sundown able need placement, Palliative care consult requested by family . Acute encephalopathy most likely secondary to dehydration will follow up on CT to rule out acute event.  . Dehydration - administer IV fluids monitor  . Leukocytosis - evaluate chest x-ray to evaluate for any other source of infection Urinary retention - if patient continues to pull foley can attempt to DC and see if able to urinate.   Prophylaxis:   HepSC  if CT negative  CODE STATUS:   DNR/DNI as per family, comfort care preferred, Ok t administer IVF fr tonight and see how she does no NG or feeding tube if refuses.   Disposition:  likely will need placement for rehabilitation      Other plan as per orders.  I have spent a total of 67 min on this admission   extra time was spent  to discuss case  With family  Keshanna Riso 05/01/2015, 9:11 PM    [facility]  Pager [phone]   after 2 AM please page floor coverage PA If 7AM-7PM, please contact the day team taking care of the patient  [email or web address]  Password [email or web address]

## 2015-05-01 NOTE — ED Provider Notes (Signed)
CSN: [id number]     Arrival date & time 05/01/15  1751 History   First MD Initiated Contact with Patient 05/01/15 1756     No chief complaint on file.    (Consider location/radiation/quality/duration/timing/severity/associated sxs/prior Treatment) HPI Comments: 82 year old female with history of high blood pressure, malnutrition, dementia, kidney disease, recent visit to the emergency department on June 2 presents with mild worsening general mental status per granddaughter. Patient had urinary catheter placed for retention patient completed antibiotics. Patient is taking care I family at home. Patient saw social worker last visit. Family is interested in nursing home placement. No fevers or head injuries. No focal neuro deficits.  The history is provided by medical records and a relative.    Past Medical History  Diagnosis Date  . Alzheimer's disease   . Hypertension   . Urinary retention    History reviewed. No pertinent past surgical history. Family History  Problem Relation Age of Onset  . Hypertension Son   . Hypertension Daughter   . Diabetes Daughter    Social History  Substance Use Topics  . Smoking status: Never Smoker   . Smokeless tobacco: None  . Alcohol Use: No   OB History    Gravida Para Term Preterm AB TAB SAB Ectopic Multiple Living            2     Review of Systems  Unable to perform ROS: Mental status change      Allergies  Review of patient's allergies indicates no known allergies.  Home Medications   Prior to Admission medications   Medication Sig Start Date End Date Taking? Authorizing Provider  amLODipine (NORVASC) 10 MG tablet TAKE ONE TABLET BY MOUTH ONCE DAILY FOR BLOOD PRESSURE 02/15/15  Yes Sharon Seller, NP  AMBULATORY NON FORMULARY MEDICATION Depends Size Large Dx: 585.3 and 331.0 12/22/13   Oneal Grout, MD  feeding supplement, ENSURE ENLIVE, (ENSURE ENLIVE) LIQD Take 237 mLs by mouth 2 (two) times daily after a meal. Patient not  taking: Reported on 05/01/2015 03/30/15   Nishant Dhungel, MD   BP 130/57 mmHg  Pulse 90  Temp(Src) 99.1 F (37.3 C) (Rectal)  Resp 24  SpO2 99% Physical Exam  Constitutional: She appears well-developed. No distress.  HENT:  Head: Normocephalic and atraumatic.  Severe dry mucous membranes  Eyes: Right eye exhibits no discharge. Left eye exhibits no discharge.  Neck: Normal range of motion. Neck supple. No tracheal deviation present.  Cardiovascular: Normal rate and regular rhythm.   Pulmonary/Chest: Effort normal and breath sounds normal.  Abdominal: Soft. She exhibits no distension. There is no tenderness. There is no guarding.  Musculoskeletal: She exhibits no edema.  Neurological: She is alert. GCS eye subscore is 4. GCS verbal subscore is 3. GCS motor subscore is 5.  Mild agitation with movement. Patient moves all extremities with equal strength bilateral, sensation to pain intact. Horizontal eye movements intact. Difficult neuro exam.  Skin: Skin is warm. No rash noted.  Psychiatric:  Confusion, no meningismus  Nursing note and vitals reviewed.   ED Course  Procedures (including critical care time) Labs Review Labs Reviewed  URINALYSIS, ROUTINE W REFLEX MICROSCOPIC (NOT AT [facility]) - Abnormal; Notable for the following:    Color, Urine AMBER (*)    APPearance TURBID (*)    Hgb urine dipstick TRACE (*)    Bilirubin Urine LARGE (*)    Ketones, ur 15 (*)    Leukocytes, UA SMALL (*)    All other components  within normal limits  BASIC METABOLIC PANEL - Abnormal; Notable for the following:    Sodium 146 (*)    CO2 20 (*)    Glucose, Bld 139 (*)    BUN 144 (*)    Creatinine, Ser 4.57 (*)    GFR calc non Af Amer 7 (*)    GFR calc Af Amer 8 (*)    Anion gap 20 (*)    All other components within normal limits  CBC WITH DIFFERENTIAL/PLATELET - Abnormal; Notable for the following:    WBC 22.7 (*)    Platelets 413 (*)    Neutro Abs 18.8 (*)    All other components within  normal limits  URINE MICROSCOPIC-ADD ON - Abnormal; Notable for the following:    Squamous Epithelial / LPF 0-5 (*)    Bacteria, UA RARE (*)    All other components within normal limits  I-STAT CG4 LACTIC ACID, ED - Abnormal; Notable for the following:    Lactic Acid, Venous 3.20 (*)    All other components within normal limits  URINE CULTURE    Imaging Review Ct Head Wo Contrast  05/01/2015  CLINICAL DATA:  Altered mental status. Alzheimer's disease. Acute encephalopathy. EXAM: CT HEAD WITHOUT CONTRAST TECHNIQUE: Contiguous axial images were obtained from the base of the skull through the vertex without intravenous contrast. COMPARISON:  03/28/2015 FINDINGS: There is no evidence of intracranial hemorrhage, brain edema, or other signs of acute infarction. There is no evidence of intracranial mass lesion or mass effect. No abnormal extraaxial fluid collections are identified. Moderate diffuse cerebral atrophy and mild chronic small vessel disease are stable in appearance. Ventricles are stable in size. No skull abnormality identified. IMPRESSION: No acute intracranial abnormality. Stable cerebral atrophy and chronic small vessel disease. Electronically Signed   By: Myles Rosenthal M.D.   On: 05/01/2015 21:27   I have personally reviewed and evaluated these images and lab results as part of my medical decision-making.   EKG Interpretation None      MDM   Final diagnoses:  Acute encephalopathy  Acute renal failure, unspecified acute renal failure type (HCC)  Dehydration  Hypernatremia  Leukocytosis   Patient presents with general worsening mental status and concern for discomfort from recent Foley catheter placed. At the bedside patient had feces surrounding Foley catheter area, general altered mental status no focal deficits. Concern for failure to thrive. Nursing assisted with cleaning patient and removing old catheter. Plan for new catheter placement urinalysis blood work and IV fluids as  concern for dehydration clinically. No fever in the ER rectal.  IV fluids.  Social work consult.  The patients results and plan were reviewed and discussed.   Any x-rays performed were independently reviewed by myself.   Differential diagnosis were considered with the presenting HPI.  Medications  sodium chloride 0.9 % bolus 1,000 mL (0 mLs Intravenous Stopped 05/01/15 2116)  cefTRIAXone (ROCEPHIN) 1 g in dextrose 5 % 50 mL IVPB (0 g Intravenous Stopped 05/01/15 2116)    Filed Vitals:   05/01/15 1845 05/01/15 1900 05/01/15 2000 05/01/15 2030  BP:   139/68 130/57  Pulse:  90    Temp:      TempSrc:      Resp: SpO2:  99%      Final diagnoses:  Acute encephalopathy  Acute renal failure, unspecified acute renal failure type (HCC)  Dehydration  Hypernatremia  Leukocytosis    Admission/ observation were discussed with the admitting physician,  patient and/or family and they are comfortable with the plan.     Blane Ohara, MD 05/01/15 2131

## 2015-05-01 NOTE — ED Notes (Signed)
Pt taken to CT.

## 2015-05-01 NOTE — Progress Notes (Signed)
New Admission Note:   Arrival: From ED with EMT Mental Orientation: Alert to self only Telemetry: None ordered Assessment:  See doc flowsheet Skin: Stage I pressure ulcer noted on the side of right big toe.  Two small ulcers also found on sacrum.  Heels red but blanchable.  Foam placed on pressure ulcers and on both heels.  Skin under left breast is red.  Skin is dry and flaky.  Patient arrived on unit with soft mitts on both hands. IV: Left FA IV Pain: None Safety Measures:  Call bell placed within reach; patient instructed on use of call bell.  Bed in lowest position.  Yellow bracelet on.  Bed alarm on. [address] Orientation: Patient oriented to staff, room, and unit. Family: None at bedside  Orders have been reviewed and implemented. Admission questions unable to be completed due to patient's altered mental status.  Will continue to monitor.  Rozann Lesches, RN, BSN

## 2015-05-01 NOTE — ED Notes (Signed)
Contacted lab regarding urinalysis, tech reports urine was never received. Will recollect

## 2015-05-01 NOTE — ED Notes (Signed)
Urine resent to lab.

## 2015-05-01 NOTE — ED Notes (Signed)
Dr. Doutova at bedside.  

## 2015-05-01 NOTE — ED Notes (Signed)
Pt here via EMS from home for pulling out her indwelling urinary catheter that was placed at [facility] 3 weeks ago for urinary retention.  Pt lives with granddaughter who states that since foley has been placed pt has been saying she has to pee constantly and pt is more demented than normal (ie - 3 weeks ago pt could state her name and now she cannot).

## 2015-05-01 NOTE — ED Notes (Signed)
Mittens placed prior to this shift, pt continually trying to pull mittens off

## 2015-05-01 NOTE — ED Notes (Signed)
Attempted to  Call report  

## 2015-05-02 DIAGNOSIS — L8990 Pressure ulcer of unspecified site, unspecified stage: Secondary | ICD-10-CM | POA: Diagnosis not present

## 2015-05-02 DIAGNOSIS — E860 Dehydration: Secondary | ICD-10-CM | POA: Diagnosis not present

## 2015-05-02 DIAGNOSIS — N179 Acute kidney failure, unspecified: Secondary | ICD-10-CM | POA: Diagnosis not present

## 2015-05-02 DIAGNOSIS — R531 Weakness: Secondary | ICD-10-CM | POA: Diagnosis not present

## 2015-05-02 DIAGNOSIS — G301 Alzheimer's disease with late onset: Secondary | ICD-10-CM | POA: Diagnosis not present

## 2015-05-02 DIAGNOSIS — D72829 Elevated white blood cell count, unspecified: Secondary | ICD-10-CM | POA: Diagnosis not present

## 2015-05-02 LAB — COMPREHENSIVE METABOLIC PANEL
ALT: 22 U/L (ref 14–54)
AST: 35 U/L (ref 15–41)
Albumin: 2.7 g/dL — ABNORMAL LOW (ref 3.5–5.0)
Alkaline Phosphatase: 86 U/L (ref 38–126)
Anion gap: 18 — ABNORMAL HIGH (ref 5–15)
BUN: 136 mg/dL — AB (ref 6–20)
CHLORIDE: 111 mmol/L (ref 101–111)
CO2: 18 mmol/L — ABNORMAL LOW (ref 22–32)
Calcium: 9 mg/dL (ref 8.9–10.3)
Creatinine, Ser: 3.81 mg/dL — ABNORMAL HIGH (ref 0.44–1.00)
GFR, EST AFRICAN AMERICAN: 11 mL/min — AB (ref >60)
GFR, EST NON AFRICAN AMERICAN: 9 mL/min — AB (ref >60)
Glucose, Bld: 110 mg/dL — ABNORMAL HIGH (ref 65–99)
POTASSIUM: 4.8 mmol/L (ref 3.5–5.1)
Sodium: 147 mmol/L — ABNORMAL HIGH (ref 135–145)
Total Bilirubin: 0.5 mg/dL (ref 0.3–1.2)
Total Protein: 7.1 g/dL (ref 6.5–8.1)

## 2015-05-02 LAB — CBC
HCT: 35.9 % — ABNORMAL LOW (ref 36.0–46.0)
Hemoglobin: 11.8 g/dL — ABNORMAL LOW (ref 12.0–15.0)
MCH: 26.6 pg (ref 26.0–34.0)
MCHC: 32.9 g/dL (ref 30.0–36.0)
MCV: 80.9 fL (ref 78.0–100.0)
PLATELETS: 319 10*3/uL (ref 150–400)
RBC: 4.44 MIL/uL (ref 3.87–5.11)
RDW: 14.7 % (ref 11.5–15.5)
WBC: 20 10*3/uL — AB (ref 4.0–10.5)

## 2015-05-02 LAB — PHOSPHORUS: PHOSPHORUS: 5 mg/dL — AB (ref 2.5–4.6)

## 2015-05-02 LAB — MAGNESIUM: Magnesium: 2.8 mg/dL — ABNORMAL HIGH (ref 1.7–2.4)

## 2015-05-02 LAB — TSH: TSH: 1.261 u[IU]/mL (ref 0.350–4.500)

## 2015-05-02 MED ORDER — SODIUM CHLORIDE 0.9 % IV SOLN
INTRAVENOUS | Status: DC
Start: 2015-05-02 — End: 2015-05-02

## 2015-05-02 NOTE — Evaluation (Signed)
Clinical/Bedside Swallow Evaluation Patient Details  Name: Bridget Cisneros MRN: [id number] Date of Birth: 1916-11-18  Today's Date: 05/02/2015 Time: SLP Start Time (ACUTE ONLY): 1131 SLP Stop Time (ACUTE ONLY): 1140 SLP Time Calculation (min) (ACUTE ONLY): 9 min  Past Medical History:  Past Medical History  Diagnosis Date  . Alzheimer's disease   . Hypertension   . Urinary retention    Past Surgical History: History reviewed. No pertinent past surgical history. HPI:  82 y.o. female past medical history of Alzheimer's disease; Hypertension; and Urinary retention. Patient's dementia has worsened she is no longer recognizes her family has been trying to pull out catheter. Family states she not been eating for few weeks. Stopped drinking ensure as well. Pt with dehydration and ARF.    Assessment / Plan / Recommendation Clinical Impression  Pt has a cognitively-based dysphagia with poor bolus awareness, but does take in very small amounts of purees and thin liquids with assist from SLP. Occasional coughing is noted before, during, and after PO intake, making it difficult to adequately assess aspriation risk across limited trials; however, given her mentation and advanced age, there would certainly be a high risk of aspiration with any PO intake. Per discussion with palliative care, family wishes to focus on comfort care, allowing careful comfort feeding. Will adjust diet to Dys 1 textures and thin liquids to facilitate intake as pt is wanting. No further acute SLP needs identified - will defer cognitive evaluation in light of family's desire for comfort.    Aspiration Risk  Moderate aspiration risk;Risk for inadequate nutrition/hydration    Diet Recommendation Dysphagia 1 (Puree);Thin liquid (comfort)   Liquid Administration via: Cup;Straw Medication Administration: Crushed with puree Supervision: Full supervision/cueing for compensatory strategies;Staff to assist with self  feeding Compensations: Minimize environmental distractions;Slow rate;Small sips/bites Postural Changes: Seated upright at 90 degrees    Other  Recommendations Oral Care Recommendations: Oral care QID   Follow up Recommendations  24 hour supervision/assistance;Other (comment) (family interested in hospice placement)    Frequency and Duration            Prognosis Prognosis for Safe Diet Advancement: Guarded Barriers to Reach Goals: Cognitive deficits      Swallow Study   General HPI: 83 y.o. female past medical history of Alzheimer's disease; Hypertension; and Urinary retention. Patient's dementia has worsened she is no longer recognizes her family has been trying to pull out catheter. Family states she not been eating for few weeks. Stopped drinking ensure as well. Pt with dehydration and ARF.  Type of Study: Bedside Swallow Evaluation Previous Swallow Assessment: none in chart Diet Prior to this Study: Dysphagia 3 (soft);Thin liquids Temperature Spikes Noted: No Respiratory Status: Room air History of Recent Intubation: No Behavior/Cognition: Alert;Confused;Doesn't follow directions Oral Care Completed by SLP: No Self-Feeding Abilities: Total assist Patient Positioning: Upright in bed Baseline Vocal Quality: Normal    Oral/Motor/Sensory Function     Ice Chips Ice chips: Not tested   Thin Liquid Thin Liquid: Impaired Presentation: Straw Oral Phase Impairments: Poor awareness of bolus Pharyngeal  Phase Impairments: Suspected delayed Swallow;Cough - Delayed    Nectar Thick Nectar Thick Liquid: Not tested   Honey Thick Honey Thick Liquid: Not tested   Puree Puree: Impaired Presentation: Spoon Oral Phase Impairments: Poor awareness of bolus Pharyngeal Phase Impairments: Cough - Delayed   Solid   GO   Solid: Not tested    Functional Assessment Tool Used: skilled clinical judgment Functional Limitations: Swallowing Swallow Current Status (W0981): At  least 60 percent  but less than 80 percent impaired, limited or restricted Swallow Goal Status (M5784): At least 60 percent but less than 80 percent impaired, limited or restricted Swallow Discharge Status [phone]): At least 60 percent but less than 80 percent impaired, limited or restricted   Maxcine Ham, M.A. CCC-SLP [phone]  Maxcine Ham 05/02/2015,12:11 PM

## 2015-05-02 NOTE — Evaluation (Addendum)
Physical Therapy Evaluation and Discharge Patient Details Name: Bridget Cisneros MRN: [id number] DOB: 01-31-1917 Today's Date: 05/02/2015   History of Present Illness  82 y.o. female past medical history of Alzheimer's disease; Hypertension; and Urinary retention. Patient's dementia has worsened she is no longer recognizes her family has been trying to pull out catheter. Family states she not been eating for few weeks. Stopped drinking ensure as well. Pt with dehydration and ARF.   Clinical Impression  Pt with above history. No family present to report cognitive and functional baseline/capabilities. Pt currently total A for all mobility. Pt unable to follow any commands or comprehend tasks asked. Pt perseverating on "where are my babies." Pt was rocking "her baby." Pt unable to actively participate in PT due to impaired cognition/comprehension. No further acute PT needs. PT SIGNING OFF. Please re-consult if needed in future.    Follow Up Recommendations SNF;Supervision/Assistance - 24 hour    Equipment Recommendations  None recommended by PT    Recommendations for Other Services  Palliative consult     Precautions / Restrictions Precautions Precautions: Fall;Other (comment) (skin breakdown.) Restrictions Weight Bearing Restrictions: No      Mobility  Bed Mobility Overal bed mobility: +2 for physical assistance;Needs Assistance Bed Mobility: Supine to Sit;Sit to Supine     Supine to sit: Total assist;+2 for physical assistance Sit to supine: Total assist;+2 for physical assistance   General bed mobility comments: Pt not initiating any movement but able to sit EOB with min guard A  Transfers Overall transfer level: Needs assistance   Transfers: Sit to/from Stand Sit to Stand: +2 physical assistance;Max assist         General transfer comment: Kyphotic with B knees fleed. would bear weight through BLE.   Ambulation/Gait             General Gait Details: pt took 2  habitual side steps towards [facility] but required total assist x2, pt did not use UEs to assist  Stairs            Wheelchair Mobility    Modified Rankin (Stroke Patients Only)       Balance Overall balance assessment: Needs assistance Sitting-balance support: Feet supported;No upper extremity supported Sitting balance-Leahy Scale: Fair Sitting balance - Comments: sat EOB x 6 min with min guard assist     Standing balance-Leahy Scale: Poor                               Pertinent Vitals/Pain Pain Assessment: Faces Faces Pain Scale: No hurt    Home Living Family/patient expects to be discharged to:: Skilled nursing facility                 Additional Comments: Pt unable to state; dementia - no family present. All information re-entered from PT's eval    Prior Function Level of Independence: Needs assistance         Comments: dependent with all ADL. Decreased PO intake. Assist with mobility.      Hand Dominance        Extremity/Trunk Assessment   Upper Extremity Assessment: Defer to OT evaluation           Lower Extremity Assessment: Generalized weakness;Difficult to assess due to impaired cognition      Cervical / Trunk Assessment: Kyphotic  Communication   Communication: HOH;Other (comment)  Cognition Arousal/Alertness: Awake/alert Behavior During Therapy: Restless Overall Cognitive Status: No family/caregiver present to  determine baseline cognitive functioning (history of alzheimers dementia)                      General Comments General comments (skin integrity, edema, etc.): pt with dressing on L heel    Exercises        Assessment/Plan    PT Assessment Patent does not need any further PT services  PT Diagnosis Generalized weakness;Difficulty walking   PT Problem List    PT Treatment Interventions     PT Goals (Current goals can be found in the Care Plan section) Acute Rehab PT Goals Patient Stated Goal: Pt  unable to state PT Goal Formulation: All assessment and education complete, DC therapy    Frequency     Barriers to discharge        Co-evaluation PT/OT/SLP Co-Evaluation/Treatment: Yes Reason for Co-Treatment: Necessary to address cognition/behavior during functional activity PT goals addressed during session: Mobility/safety with mobility OT goals addressed during session: ADL's and self-care       End of Session   Activity Tolerance: Patient tolerated treatment well Patient left: in bed;with call bell/phone within reach;with restraints reapplied Nurse Communication: Mobility status    Functional Assessment Tool Used: clinical judgement Functional Limitation: Mobility: Walking and moving around Mobility: Walking and Moving Around Current Status (Z6109): At least 60 percent but less than 80 percent impaired, limited or restricted Mobility: Walking and Moving Around Goal Status [phone]): At least 60 percent but less than 80 percent impaired, limited or restricted Mobility: Walking and Moving Around Discharge Status [phone]): At least 60 percent but less than 80 percent impaired, limited or restricted    Time: 0942-0954 PT Time Calculation (min) (ACUTE ONLY): 12 min   Charges:   PT Evaluation $PT Eval Low Complexity: 1 Procedure     PT G Codes:   PT G-Codes **NOT FOR INPATIENT CLASS** Functional Assessment Tool Used: clinical judgement Functional Limitation: Mobility: Walking and moving around Mobility: Walking and Moving Around Current Status (B1478): At least 60 percent but less than 80 percent impaired, limited or restricted Mobility: Walking and Moving Around Goal Status [phone]): At least 60 percent but less than 80 percent impaired, limited or restricted Mobility: Walking and Moving Around Discharge Status [phone]): At least 60 percent but less than 80 percent impaired, limited or restricted    Marcene Brawn 05/02/2015, 10:57 AM   Lewis Shock, PT,  DPT Pager #: [phone] Office #: [phone]

## 2015-05-02 NOTE — Progress Notes (Signed)
Pts Son is poa and will fax documents. Clement Husbands [phone].

## 2015-05-02 NOTE — Progress Notes (Signed)
PROGRESS NOTE  Bridget Cisneros JXB:[id number] DOB: 1917-01-30 DOA: 05/01/2015 PCP: Sharon Seller, NP  Assessment/Plan: Failure to thrive due to Alzheimer's dementia -spoke with granddaughter--- plan for hospice residential vs SNF with hospice  Acute renal failure superimposed on stage 3 chronic kidney disease (HCC) - most likely secondary to dehydration  -d/c IVF -comfort focus  Alzheimer's disease -end of life -hospice  Dehydration -d/c IVF -comfort focus  Leukocytosis with RLL PNA- suspect aspiration -comfort focus  Urinary retention  -continue foley  hypernatremia -due to dehydration  Code Status: DNR Family Communication: granddaughter Disposition Plan: hospice   Consultants:  palliative care  Procedures:      HPI/Subjective: Says the BP cuff hurts  Objective: Filed Vitals:   05/02/15 0607 05/02/15 0948  BP: 103/45 120/69  Pulse: 74 69  Temp: 97.4 F (36.3 C) 98.2 F (36.8 C)  Resp: 18 18    Intake/Output Summary (Last 24 hours) at 05/02/15 1226 Last data filed at 05/02/15 0900  Gross per 24 hour  Intake 2178.33 ml  Output    200 ml  Net 1978.33 ml   There were no vitals filed for this visit.  Exam:   General:  wearing mittens, confused  Cardiovascular: rrr  Respiratory: no wheezing  Abdomen: +BS, soft  Musculoskeletal: moves all 4 ext   Data Reviewed: Basic Metabolic Panel:  Recent Labs Lab 05/01/15 1854 05/02/15 0614  NA 146* 147*  K 4.5 4.8  CL 106 111  CO2 20* 18*  GLUCOSE 139* 110*  BUN 144* 136*  CREATININE 4.57* 3.81*  CALCIUM 9.9 9.0  MG  --  2.8*  PHOS  --  5.0*   Liver Function Tests:  Recent Labs Lab 05/02/15 0614  AST 35  ALT 22  ALKPHOS 86  BILITOT 0.5  PROT 7.1  ALBUMIN 2.7*   No results for input(s): LIPASE, AMYLASE in the last 168 hours. No results for input(s): AMMONIA in the last 168 hours. CBC:  Recent Labs Lab 05/01/15 1854 05/02/15 0614  WBC 22.7* 20.0*  NEUTROABS  18.8*  --   HGB 13.2 11.8*  HCT 40.6 35.9*  MCV 81.5 80.9  PLT 413* 319   Cardiac Enzymes: No results for input(s): CKTOTAL, CKMB, CKMBINDEX, TROPONINI in the last 168 hours. BNP (last 3 results) No results for input(s): BNP in the last 8760 hours.  ProBNP (last 3 results) No results for input(s): PROBNP in the last 8760 hours.  CBG: No results for input(s): GLUCAP in the last 168 hours.  No results found for this or any previous visit (from the past 240 hour(s)).   Studies: Dg Chest 2 View  05/01/2015  CLINICAL DATA:  Cough.  Leukocytosis. EXAM: CHEST  2 VIEW COMPARISON:  04/04/2015 FINDINGS: There is focal airspace disease in the right lower lobe consistent with pneumonia given the clinical circumstances. No effusion. Normal heart size and aortic contours. IMPRESSION: Right lower lobe pneumonia. Electronically Signed   By: Marnee Spring M.D.   On: 05/01/2015 22:01   Ct Head Wo Contrast  05/01/2015  CLINICAL DATA:  Altered mental status. Alzheimer's disease. Acute encephalopathy. EXAM: CT HEAD WITHOUT CONTRAST TECHNIQUE: Contiguous axial images were obtained from the base of the skull through the vertex without intravenous contrast. COMPARISON:  03/28/2015 FINDINGS: There is no evidence of intracranial hemorrhage, brain edema, or other signs of acute infarction. There is no evidence of intracranial mass lesion or mass effect. No abnormal extraaxial fluid collections are identified. Moderate diffuse cerebral atrophy and mild  chronic small vessel disease are stable in appearance. Ventricles are stable in size. No skull abnormality identified. IMPRESSION: No acute intracranial abnormality. Stable cerebral atrophy and chronic small vessel disease. Electronically Signed   By: Myles Rosenthal M.D.   On: 05/01/2015 21:27    Scheduled Meds: . amLODipine  10 mg Oral Daily  . feeding supplement (ENSURE ENLIVE)  237 mL Oral BID PC  . heparin  5,000 Units Subcutaneous 3 times per day   Continuous  Infusions:  Antibiotics Given (last 72 hours)    None      Active Problems:   Essential hypertension, benign   Acute renal failure superimposed on stage 3 chronic kidney disease (HCC)   Alzheimer's disease   Acute encephalopathy   Generalized weakness   Dehydration   Leukocytosis   Acute renal failure (ARF) (HCC)   Pressure ulcer    Time spent: 25 min    Chlora Mcbain U [facility]  [facility] Pager [phone]. If 7PM-7AM, please contact night-coverage at [email or web address], password [facility] 05/02/2015, 12:26 PM

## 2015-05-02 NOTE — Progress Notes (Signed)
Occupational Therapy Evaluation Patient Details Name: Bridget Cisneros MRN: [id number] DOB: 1916-08-19 Today's Date: 05/02/2015    History of Present Illness 82 y.o. female past medical history of Alzheimer's disease; Hypertension; and Urinary retention. Patient's dementia has worsened she is no longer recognizes her family has been trying to pull out catheter. Family states she not been eating for few weeks. Stopped drinking ensure as well. Pt with dehydration and ARF.    Clinical Impression   No family present for eval. Pt currently total A for ADL and +2 total a for mobility. Recommend pt D/C to SNF for 24/7 care. Pt not following commands but able to sit EOB with total A +2 and maintain sitting balance with minguard assist and sing hymns. No further acute OT needs. OT signing off.     Follow Up Recommendations  SNF;Supervision/Assistance - 24 hour    Equipment Recommendations  None recommended by OT    Recommendations for Other Services  Palliative care consult     Precautions / Restrictions Precautions Precautions: Fall;Other (comment) (skin breakdown.)      Mobility Bed Mobility Overal bed mobility: +2 for physical assistance;Needs Assistance Bed Mobility: Supine to Sit;Sit to Supine     Supine to sit: Total assist;+2 for physical assistance Sit to supine: Total assist;+2 for physical assistance   General bed mobility comments: Pt not initiating any movement but able to sit EOB with min guard A  Transfers Overall transfer level: Needs assistance   Transfers: Sit to/from Stand Sit to Stand: +2 physical assistance;Max assist         General transfer comment: Kyphotic with B knees fleed. would bear weight through BLE.     Balance Overall balance assessment: Needs assistance   Sitting balance-Leahy Scale: Fair       Standing balance-Leahy Scale: Poor                              ADL Overall ADL's : Needs assistance/impaired                                      Functional mobility during ADLs: Total assistance;+2 for physical assistance General ADL Comments: total A for all ADL     Vision     Perception     Praxis      Pertinent Vitals/Pain Pain Assessment: Faces Faces Pain Scale: No hurt     Hand Dominance     Extremity/Trunk Assessment Upper Extremity Assessment Upper Extremity Assessment: Generalized weakness   Lower Extremity Assessment Lower Extremity Assessment: Defer to PT evaluation   Cervical / Trunk Assessment Cervical / Trunk Assessment: Kyphotic   Communication Communication Communication: HOH;Other (comment)   Cognition Arousal/Alertness: Awake/alert Behavior During Therapy: Restless Overall Cognitive Status: No family/caregiver present to determine baseline cognitive functioning (history of cognitive impairment)                     General Comments       Exercises       Shoulder Instructions      Home Living Family/patient expects to be discharged to:: Skilled nursing facility                                        Prior Functioning/Environment Level  of Independence: Needs assistance        Comments: dependent with all ADL. Decreased PO intake. Assist with mobility.     OT Diagnosis: Generalized weakness;Cognitive deficits;Altered mental status   OT Problem List: Decreased strength;Decreased activity tolerance;Impaired balance (sitting and/or standing);Decreased cognition;Decreased safety awareness   OT Treatment/Interventions:      OT Goals(Current goals can be found in the care plan section) Acute Rehab OT Goals Patient Stated Goal: Pt unable to state OT Goal Formulation: Patient unable to participate in goal setting  OT Frequency:     Barriers to D/C:            Co-evaluation PT/OT/SLP Co-Evaluation/Treatment: Yes Reason for Co-Treatment: Necessary to address cognition/behavior during functional activity;For  patient/therapist safety   OT goals addressed during session: ADL's and self-care      End of Session Nurse Communication: Mobility status  Activity Tolerance: Patient tolerated treatment well Patient left: in bed;with call bell/phone within reach;with bed alarm set;with restraints reapplied   Time: 1610-9604 OT Time Calculation (min): 24 min Charges:  OT General Charges $OT Visit: 1 Procedure OT Evaluation $OT Eval Moderate Complexity: 1 Procedure G-Codes: OT G-codes **NOT FOR INPATIENT CLASS** Functional Assessment Tool Used: clinical judgement Functional Limitation: Self care Self Care Current Status (V4098): 100 percent impaired, limited or restricted Self Care Goal Status (J1914): 100 percent impaired, limited or restricted Self Care Discharge Status (N8295): 100 percent impaired, limited or restricted  Zoie Sarin,HILLARY 05/02/2015, 10:06 AM   [facility], OTR/L  [phone] 05/02/2015

## 2015-05-02 NOTE — Progress Notes (Signed)
Palliative consult received. Patient seen and she is end stage dementia. Agitated and perseverating- she is HOH, able to tell us her first and middle name only. High aspiration risk, minimal PO intake, cannot coordinate swallowing or follow commands. She is very hospice appropriate with a prognosis of <6 months given poor PO intake renal failure. She has likely been hydrated now and takes very small amounts calories. She is probably not at EOL in terms of eligible for hospice facility but will likely have that need soon. Will need SNF with palliative at minimum following, with transition into hospice when she declines- avoid readmission, comfort are main goals. Will speak with family by phone today and confirm goals.  Anderson Malta, DO Palliative Medicine [phone]

## 2015-05-03 DIAGNOSIS — E860 Dehydration: Secondary | ICD-10-CM | POA: Diagnosis not present

## 2015-05-03 DIAGNOSIS — G301 Alzheimer's disease with late onset: Secondary | ICD-10-CM | POA: Diagnosis not present

## 2015-05-03 DIAGNOSIS — R531 Weakness: Secondary | ICD-10-CM

## 2015-05-03 DIAGNOSIS — N179 Acute kidney failure, unspecified: Secondary | ICD-10-CM | POA: Diagnosis not present

## 2015-05-03 NOTE — Progress Notes (Signed)
Nutrition Brief Note  Pt identified by low braden score. Chart reviewed. Pt now comfort focus with comfort feeds. Noted Ensure has been ordered and pt has been consuming them. RD to continue with orders. No further nutrition interventions warranted at this time. Please re-consult as needed.   Roslyn Smiling, MS, RD, LDN Pager # [phone] After hours/ weekend pager # [phone]

## 2015-05-03 NOTE — Progress Notes (Signed)
[facility]  Patient Status: Active (receiving services up to time of hospitalization)  [facility] is providing the following services: RN and MSW  If patient discharges after hours, please call [phone].   Bridget Cisneros 05/03/2015, 11:22 AM

## 2015-05-03 NOTE — Progress Notes (Signed)
PROGRESS NOTE  Bridget Cisneros RUE:[id number] DOB: [birth year] DOA: 05/01/2015 PCP: Sharon Seller, NP  Assessment/Plan: Failure to thrive due to Alzheimer's dementia - plan for hospice residential vs SNF with hospice -defer to palliative  Acute renal failure superimposed on stage 3 chronic kidney disease (HCC) - most likely secondary to dehydration  -d/c IVF -comfort focus  Alzheimer's disease -end of life -hospice -comfort feeds  Dehydration -d/c IVF -comfort focus  Leukocytosis with RLL PNA- suspect aspiration -comfort focus  Urinary retention  -continue foley  hypernatremia -due to dehydration  Code Status: DNR Family Communication: called son Disposition Plan: hospice   Consultants:  palliative care  Procedures:      HPI/Subjective: Awake, preaching  Objective: Filed Vitals:   05/03/15 0515 05/03/15 0839  BP: 140/96 120/69  Pulse: 79 84  Temp: 98.4 F (36.9 C) 97.5 F (36.4 C)  Resp: 18 17    Intake/Output Summary (Last 24 hours) at 05/03/15 0858 Last data filed at 05/03/15 0600  Gross per 24 hour  Intake    400 ml  Output    900 ml  Net   -500 ml   There were no vitals filed for this visit.  Exam:   General:  wearing mittens, soft voice  Cardiovascular: rrr  Respiratory: no wheezing  Abdomen: +BS, soft  Musculoskeletal: moves all 4 ext   Data Reviewed: Basic Metabolic Panel:  Recent Labs Lab 05/01/15 1854 05/02/15 0614  NA 146* 147*  K 4.5 4.8  CL 106 111  CO2 20* 18*  GLUCOSE 139* 110*  BUN 144* 136*  CREATININE 4.57* 3.81*  CALCIUM 9.9 9.0  MG  --  2.8*  PHOS  --  5.0*   Liver Function Tests:  Recent Labs Lab 05/02/15 0614  AST 35  ALT 22  ALKPHOS 86  BILITOT 0.5  PROT 7.1  ALBUMIN 2.7*   No results for input(s): LIPASE, AMYLASE in the last 168 hours. No results for input(s): AMMONIA in the last 168 hours. CBC:  Recent Labs Lab 05/01/15 1854 05/02/15 0614  WBC 22.7* 20.0*  NEUTROABS  18.8*  --   HGB 13.2 11.8*  HCT 40.6 35.9*  MCV 81.5 80.9  PLT 413* 319   Cardiac Enzymes: No results for input(s): CKTOTAL, CKMB, CKMBINDEX, TROPONINI in the last 168 hours. BNP (last 3 results) No results for input(s): BNP in the last 8760 hours.  ProBNP (last 3 results) No results for input(s): PROBNP in the last 8760 hours.  CBG: No results for input(s): GLUCAP in the last 168 hours.  Recent Results (from the past 240 hour(s))  Urine culture     Status: None (Preliminary result)   Collection Time: 05/01/15  6:42 PM  Result Value Ref Range Status   Specimen Description URINE, CATHETERIZED  Final   Special Requests NONE  Final   Culture CULTURE REINCUBATED FOR BETTER GROWTH  Final   Report Status PENDING  Incomplete     Studies: Dg Chest 2 View  05/01/2015  CLINICAL DATA:  Cough.  Leukocytosis. EXAM: CHEST  2 VIEW COMPARISON:  04/04/2015 FINDINGS: There is focal airspace disease in the right lower lobe consistent with pneumonia given the clinical circumstances. No effusion. Normal heart size and aortic contours. IMPRESSION: Right lower lobe pneumonia. Electronically Signed   By: Marnee Spring M.D.   On: 05/01/2015 22:01   Ct Head Wo Contrast  05/01/2015  CLINICAL DATA:  Altered mental status. Alzheimer's disease. Acute encephalopathy. EXAM: CT HEAD WITHOUT CONTRAST TECHNIQUE: Contiguous  axial images were obtained from the base of the skull through the vertex without intravenous contrast. COMPARISON:  03/28/2015 FINDINGS: There is no evidence of intracranial hemorrhage, brain edema, or other signs of acute infarction. There is no evidence of intracranial mass lesion or mass effect. No abnormal extraaxial fluid collections are identified. Moderate diffuse cerebral atrophy and mild chronic small vessel disease are stable in appearance. Ventricles are stable in size. No skull abnormality identified. IMPRESSION: No acute intracranial abnormality. Stable cerebral atrophy and chronic  small vessel disease. Electronically Signed   By: Myles Rosenthal M.D.   On: 05/01/2015 21:27    Scheduled Meds: . amLODipine  10 mg Oral Daily  . feeding supplement (ENSURE ENLIVE)  237 mL Oral BID PC  . heparin  5,000 Units Subcutaneous 3 times per day   Continuous Infusions:  Antibiotics Given (last 72 hours)    None      Active Problems:   Essential hypertension, benign   Acute renal failure superimposed on stage 3 chronic kidney disease (HCC)   Alzheimer's disease   Acute encephalopathy   Generalized weakness   Dehydration   Leukocytosis   Acute renal failure (ARF) (HCC)   Pressure ulcer    Time spent: 25 min    Jaxden Blyden U [facility]  [facility] Pager [phone]. If 7PM-7AM, please contact night-coverage at [email or web address], password [facility] 05/03/2015, 8:58 AM

## 2015-05-03 NOTE — Care Management Note (Signed)
Case Management Note  Patient Details  Name: Bridget Cisneros MRN: [id number] Date of Birth: 12-02-1916  Subjective/Objective:          CM following for progression and d/c planning.          Action/Plan: 05/03/2015 Plan for d/c today , family making decision re d/c plan, choosing from home with hospice, SNF with hospice services or residential hospice. Family expressed concerns re home care and are not confident that they can provide adequate care for this pt at home even with the assistance of home hospice services.  PT has evaluated this pt and signed off stating that she is unable to participate in PT activity due to inability to follow instructions. CSW, Alvina Chou has request a review by residential hospice representative for possible admission.  Await residential hospice eval.   Expected Discharge Date:    05/04/2015              Expected Discharge Plan:  Hospice Medical Facility  In-House Referral:  Clinical Social Work  Discharge planning Services  CM Consult  Post Acute Care Choice:  NA Choice offered to:  NA  DME Arranged:    DME Agency:     HH Arranged:    HH Agency:     Status of Service:  In process, will continue to follow  Medicare Important Message Given:    Date Medicare IM Given:    Medicare IM give by:    Date Additional Medicare IM Given:    Additional Medicare Important Message give by:     If discussed at Long Length of Stay Meetings, dates discussed:    Additional Comments:  Starlyn Skeans, RN 05/03/2015, 5:00 PM

## 2015-05-03 NOTE — Clinical Social Work Note (Signed)
CSW received consult for residential hospice placement. Call made to Rosanne Sack, granddaughter [phone]) and primary caregiver of patient regarding hospice facility preferences. Ms. Adriana Simas was aware of decision for hospice and informed CSW that her facility choice is [facility]. Ms. Steenbergen advised that son is aware of d/c plan and she will f/u with patient's son Brenae Lasecki [facility]) regarding a 2nd choice.  Call made to Forrestine Him, CSW with [facility] and message left regarding referral for hospice.   Genelle Bal, MSW, LCSW Licensed Clinical Social Worker Clinical Social Work Department [employer] [phone]

## 2015-05-04 DIAGNOSIS — G309 Alzheimer's disease, unspecified: Secondary | ICD-10-CM | POA: Diagnosis not present

## 2015-05-04 DIAGNOSIS — F0280 Dementia in other diseases classified elsewhere without behavioral disturbance: Secondary | ICD-10-CM | POA: Diagnosis not present

## 2015-05-04 DIAGNOSIS — Z789 Other specified health status: Secondary | ICD-10-CM | POA: Diagnosis not present

## 2015-05-04 DIAGNOSIS — G9340 Encephalopathy, unspecified: Secondary | ICD-10-CM

## 2015-05-04 DIAGNOSIS — G308 Other Alzheimer's disease: Secondary | ICD-10-CM | POA: Diagnosis not present

## 2015-05-04 DIAGNOSIS — E870 Hyperosmolality and hypernatremia: Secondary | ICD-10-CM | POA: Insufficient documentation

## 2015-05-04 DIAGNOSIS — N179 Acute kidney failure, unspecified: Secondary | ICD-10-CM | POA: Diagnosis not present

## 2015-05-04 DIAGNOSIS — F0281 Dementia in other diseases classified elsewhere with behavioral disturbance: Secondary | ICD-10-CM

## 2015-05-04 DIAGNOSIS — N183 Chronic kidney disease, stage 3 (moderate): Secondary | ICD-10-CM

## 2015-05-04 DIAGNOSIS — E860 Dehydration: Secondary | ICD-10-CM

## 2015-05-04 LAB — URINE CULTURE: Culture: 10000

## 2015-05-04 LAB — GLUCOSE, CAPILLARY: Glucose-Capillary: 90 mg/dL (ref 65–99)

## 2015-05-04 MED ORDER — HYDROCODONE-ACETAMINOPHEN 5-325 MG PO TABS: 1.0000 | ORAL_TABLET | ORAL | Status: AC | PRN

## 2015-05-04 NOTE — Discharge Summary (Signed)
Physician Discharge Summary  Bridget Cisneros ZOX:[id number] DOB: 03-02-1917 DOA: 05/01/2015  PCP: Sharon Seller, NP  Admit date: 05/01/2015 Discharge date: 05/04/2015  PATIENT DISCHARGE TO RESIDENTIAL HOSPICE  Discharge Diagnoses:  Failure to thrive  - plan for hospice residential  -palliative medicine consulted -after discussion with palliative medicine and the patient's family.  It was agreed upon to transition the patient's focus of care to that of full comfort  Acute renal failure superimposed on stage 3 chronic kidney disease (HCC) - most likely secondary to dehydration  -Initially started on intravenous fluids with improvement of renal function -after discussion with palliative medicine and the patient's family.  It was agreed upon to transition the patient's focus of care to that of full comfort  Alzheimer's disease -after discussion with palliative medicine and the patient's family.  It was agreed upon to transition the patient's focus of care to that of full comfort -hospice -comfort feeds  Dehydration -Patient was initially started on intravenous fluids -d/c IVF after discussion with palliative medicine and the patient's family.  It was agreed upon to transition the patient's focus of care to that of full comfort  Leukocytosis with RLL PNA- suspect aspiration -comfort focus -initially started on antibiotics -after discussion with palliative medicine and the patient's family.  It was agreed upon to transition the patient's focus of care to that of full comfort  Urinary retention  -continue foley  hypernatremia -due to dehydration  Discharge Condition: STABLE   Disposition: Residential Hospice  Diet:dysphagia 1 with thin liquids/comfort feedings Wt Readings from Last 3 Encounters:  05/03/15 60.7 kg (133 lb 13.1 oz)  04/04/15 72.122 kg (159 lb)  06/07/14 74.39 kg (164 lb)    History of present illness:  82 year old female with a history of hypertension,  Alzheimer's dementia presented to the emergency department with confusion and decreased oral intake and generalized malaise. The patient was not longer able to provide care for the patient home.Patient's dementia has worsened she is no longer recognizes her family has been trying to pull out catheter. Family states she not been eating for few weeks. The patient was brought to the emergency department. Initial workup suggested pneumonia. She was started on intravenous fluids and intravenous antibiotics. Palliative medicine was consulted. After discussion with the patient's family, it was felt that he was in the patient's best interest to transition the the patient's focus of care for comfort. Social work assisted with placement to residential hospice.  Consultants: Palliative medicine  Discharge Exam: Filed Vitals:   05/04/15 0506 05/04/15 1000  BP: 155/92 111/82  Pulse: 68 78  Temp: 97.4 F (36.3 C) 98.2 F (36.8 C)  Resp: 19 20   Filed Vitals:   05/03/15 1628 05/03/15 2047 05/04/15 0506 05/04/15 1000  BP: 148/98 129/82 155/92 111/82  Pulse: 69 70 68 78  Temp: 98 F (36.7 C) 97.5 F (36.4 C) 97.4 F (36.3 C) 98.2 F (36.8 C)  TempSrc: Axillary Oral Axillary Axillary  Resp: Weight:  60.7 kg (133 lb 13.1 oz)    SpO2: 98% 99% 99% 98%   General: Awake and alert, NAD, pleasant, cooperative Cardiovascular: RRR, no rub, no gallop, no S3 Respiratory: Bibasilar crackles, right greater than left. No wheezing Abdomen:soft, nontender, nondistended, positive bowel sounds Extremities: No edema, No lymphangitis, no petechiae  Discharge Instructions     Medication List    STOP taking these medications        amLODipine 10 MG tablet  Commonly known  as:  NORVASC      TAKE these medications        AMBULATORY NON FORMULARY MEDICATION  Depends Size Large Dx: 585.3 and 331.0     feeding supplement (ENSURE ENLIVE) Liqd  Take 237 mLs by mouth 2 (two) times daily after a  meal.     HYDROcodone-acetaminophen 5-325 MG tablet  Commonly known as:  NORCO/VICODIN  Take 1-2 tablets by mouth every 4 (four) hours as needed for moderate pain.         The results of significant diagnostics from this hospitalization (including imaging, microbiology, ancillary and laboratory) are listed below for reference.    Significant Diagnostic Studies: Dg Chest 2 View  05/01/2015  CLINICAL DATA:  Cough.  Leukocytosis. EXAM: CHEST  2 VIEW COMPARISON:  04/04/2015 FINDINGS: There is focal airspace disease in the right lower lobe consistent with pneumonia given the clinical circumstances. No effusion. Normal heart size and aortic contours. IMPRESSION: Right lower lobe pneumonia. Electronically Signed   By: Marnee Spring M.D.   On: 05/01/2015 22:01   Ct Head Wo Contrast  05/01/2015  CLINICAL DATA:  Altered mental status. Alzheimer's disease. Acute encephalopathy. EXAM: CT HEAD WITHOUT CONTRAST TECHNIQUE: Contiguous axial images were obtained from the base of the skull through the vertex without intravenous contrast. COMPARISON:  03/28/2015 FINDINGS: There is no evidence of intracranial hemorrhage, brain edema, or other signs of acute infarction. There is no evidence of intracranial mass lesion or mass effect. No abnormal extraaxial fluid collections are identified. Moderate diffuse cerebral atrophy and mild chronic small vessel disease are stable in appearance. Ventricles are stable in size. No skull abnormality identified. IMPRESSION: No acute intracranial abnormality. Stable cerebral atrophy and chronic small vessel disease. Electronically Signed   By: Myles Rosenthal M.D.   On: 05/01/2015 21:27   Dg Abd Acute W/chest  04/04/2015  CLINICAL DATA:  Altered mental status. Failure to thrive. Loss of appetite. EXAM: DG ABDOMEN ACUTE W/ 1V CHEST Decubitus film substituted for upright radiograph. COMPARISON:  03/28/2015. FINDINGS: Kyphosis results in partial obscure a shin of the apices similar to  priors. Cardiomegaly.  Low lung volumes.  No active infiltrates or failure. Nonobstructive gas pattern. Degenerative change lumbar spine. No free air. No abnormal calcifications. Moderate stool burden. IMPRESSION: Negative abdominal radiographs. No acute cardiopulmonary disease. Cardiomegaly. Electronically Signed   By: Elsie Stain M.D.   On: 04/04/2015 14:22     Microbiology: Recent Results (from the past 240 hour(s))  Urine culture     Status: None   Collection Time: 05/01/15  6:42 PM  Result Value Ref Range Status   Specimen Description URINE, CATHETERIZED  Final   Special Requests NONE  Final   Culture 10,000 COLONIES/mL ESCHERICHIA COLI  Final   Report Status 05/04/2015 FINAL  Final   Organism ID, Bacteria ESCHERICHIA COLI  Final      Susceptibility   Escherichia coli - MIC*    AMPICILLIN >=32 RESISTANT Resistant     CEFAZOLIN <=4 SENSITIVE Sensitive     CEFTRIAXONE <=1 SENSITIVE Sensitive     CIPROFLOXACIN <=0.25 SENSITIVE Sensitive     GENTAMICIN <=1 SENSITIVE Sensitive     IMIPENEM <=0.25 SENSITIVE Sensitive     NITROFURANTOIN <=16 SENSITIVE Sensitive     TRIMETH/SULFA >=320 RESISTANT Resistant     AMPICILLIN/SULBACTAM 4 SENSITIVE Sensitive     PIP/TAZO <=4 SENSITIVE Sensitive     * 10,000 COLONIES/mL ESCHERICHIA COLI     Labs: Basic Metabolic Panel:  Recent Labs Lab  05/01/15 1854 05/02/15 0614  NA 146* 147*  K 4.5 4.8  CL 106 111  CO2 20* 18*  GLUCOSE 139* 110*  BUN 144* 136*  CREATININE 4.57* 3.81*  CALCIUM 9.9 9.0  MG  --  2.8*  PHOS  --  5.0*   Liver Function Tests:  Recent Labs Lab 05/02/15 0614  AST 35  ALT 22  ALKPHOS 86  BILITOT 0.5  PROT 7.1  ALBUMIN 2.7*   No results for input(s): LIPASE, AMYLASE in the last 168 hours. No results for input(s): AMMONIA in the last 168 hours. CBC:  Recent Labs Lab 05/01/15 1854 05/02/15 0614  WBC 22.7* 20.0*  NEUTROABS 18.8*  --   HGB 13.2 11.8*  HCT 40.6 35.9*  MCV 81.5 80.9  PLT 413* 319    Cardiac Enzymes: No results for input(s): CKTOTAL, CKMB, CKMBINDEX, TROPONINI in the last 168 hours. BNP: Invalid input(s): POCBNP CBG:  Recent Labs Lab 05/04/15 0729  GLUCAP 90    Time coordinating discharge:  Greater than 30 minutes  Signed:  Tyra Michelle, DO [facility] Pager: [phone] 05/04/2015, 11:04 AM

## 2015-05-04 NOTE — Clinical Social Work Note (Signed)
Contact made with [facility] today and after evaluation, patient eligible for [facility] facility. Patient will d/c to hospice facility Thursday morning.   Genelle Bal, MSW, LCSW Licensed Clinical Social Worker Clinical Social Work Department [employer] [phone]

## 2015-05-05 NOTE — Progress Notes (Signed)
PROGRESS NOTE  Bridget Cisneros ZOX:[id number] DOB: [birth year] DOA: 05/01/2015 PCP: Bridget Seller, NP  Assessment/Plan: Failure to thrive  - plan for hospice residential  -palliative medicine consulted -after discussion with palliative medicine and the patient's family. It was agreed upon to transition the patient's focus of care to that of full comfort  Acute renal failure superimposed on stage 3 chronic kidney disease (HCC) - most likely secondary to dehydration  -Initially started on intravenous fluids with improvement of renal function -after discussion with palliative medicine and the patient's family. It was agreed upon to transition the patient's focus of care to that of full comfort  Alzheimer's disease -after discussion with palliative medicine and the patient's family. It was agreed upon to transition the patient's focus of care to that of full comfort -hospice -comfort feeds  Dehydration -Patient was initially started on intravenous fluids -d/c IVF after discussion with palliative medicine and the patient's family. It was agreed upon to transition the patient's focus of care to that of full comfort  Leukocytosis with RLL PNA- suspect aspiration -comfort focus -initially started on antibiotics -after discussion with palliative medicine and the patient's family. It was agreed upon to transition the patient's focus of care to that of full comfort  Urinary retention  -continue foley  hypernatremia -due to dehydration     Family Communication:   Pt at beside Disposition Plan:   Residential hospice      Procedures/Studies: Dg Chest 2 View  05/01/2015  CLINICAL DATA:  Cough.  Leukocytosis. EXAM: CHEST  2 VIEW COMPARISON:  04/04/2015 FINDINGS: There is focal airspace disease in the right lower lobe consistent with pneumonia given the clinical circumstances. No effusion. Normal heart size and aortic contours. IMPRESSION: Right lower lobe  pneumonia. Electronically Signed   By: Marnee Spring M.D.   On: 05/01/2015 22:01   Ct Head Wo Contrast  05/01/2015  CLINICAL DATA:  Altered mental status. Alzheimer's disease. Acute encephalopathy. EXAM: CT HEAD WITHOUT CONTRAST TECHNIQUE: Contiguous axial images were obtained from the base of the skull through the vertex without intravenous contrast. COMPARISON:  03/28/2015 FINDINGS: There is no evidence of intracranial hemorrhage, brain edema, or other signs of acute infarction. There is no evidence of intracranial mass lesion or mass effect. No abnormal extraaxial fluid collections are identified. Moderate diffuse cerebral atrophy and mild chronic small vessel disease are stable in appearance. Ventricles are stable in size. No skull abnormality identified. IMPRESSION: No acute intracranial abnormality. Stable cerebral atrophy and chronic small vessel disease. Electronically Signed   By: Myles Rosenthal M.D.   On: 05/01/2015 21:27         Subjective: Patient is pleasantly confused. No history of uncontrolled pain, respiratory distress, vomiting, diarrhea  Objective: Filed Vitals:   05/04/15 1000 05/04/15 1728 05/05/15 0503 05/05/15 0742  BP: 111/82 106/78 117/46 120/46  Pulse: 78 80 56 64  Temp: 98.2 F (36.8 C) 98 F (36.7 C) 97.5 F (36.4 C) 98.2 F (36.8 C)  TempSrc: Axillary  Axillary Axillary  Resp: Weight:      SpO2: 98% 98% 96% 97%    Intake/Output Summary (Last 24 hours) at 05/05/15 1806 Last data filed at 05/05/15 0504  Gross per 24 hour  Intake      0 ml  Output    600 ml  Net   -600 ml   Weight change:  Exam:   General:  Pt is  alert, not in acute distress  HEENT: No icterus, No thrush, No neck mass, [place]/AT  Cardiovascular: RRR, S1/S2, no rubs, no gallops  Respiratory: Bibasilar crackles. No wheeze.  Abdomen: Soft/+BS, non tender, non distended, no guarding  Extremities: No edema, No lymphangitis, No petechiae, No rashes, no synovitis  Data  Reviewed: Basic Metabolic Panel:  Recent Labs Lab 05/01/15 1854 05/02/15 0614  NA 146* 147*  K 4.5 4.8  CL 106 111  CO2 20* 18*  GLUCOSE 139* 110*  BUN 144* 136*  CREATININE 4.57* 3.81*  CALCIUM 9.9 9.0  MG  --  2.8*  PHOS  --  5.0*   Liver Function Tests:  Recent Labs Lab 05/02/15 0614  AST 35  ALT 22  ALKPHOS 86  BILITOT 0.5  PROT 7.1  ALBUMIN 2.7*   No results for input(s): LIPASE, AMYLASE in the last 168 hours. No results for input(s): AMMONIA in the last 168 hours. CBC:  Recent Labs Lab 05/01/15 1854 05/02/15 0614  WBC 22.7* 20.0*  NEUTROABS 18.8*  --   HGB 13.2 11.8*  HCT 40.6 35.9*  MCV 81.5 80.9  PLT 413* 319   Cardiac Enzymes: No results for input(s): CKTOTAL, CKMB, CKMBINDEX, TROPONINI in the last 168 hours. BNP: Invalid input(s): POCBNP CBG:  Recent Labs Lab 05/04/15 0729  GLUCAP 90    Recent Results (from the past 240 hour(s))  Urine culture     Status: None   Collection Time: 05/01/15  6:42 PM  Result Value Ref Range Status   Specimen Description URINE, CATHETERIZED  Final   Special Requests NONE  Final   Culture 10,000 COLONIES/mL ESCHERICHIA COLI  Final   Report Status 05/04/2015 FINAL  Final   Organism ID, Bacteria ESCHERICHIA COLI  Final      Susceptibility   Escherichia coli - MIC*    AMPICILLIN >=32 RESISTANT Resistant     CEFAZOLIN <=4 SENSITIVE Sensitive     CEFTRIAXONE <=1 SENSITIVE Sensitive     CIPROFLOXACIN <=0.25 SENSITIVE Sensitive     GENTAMICIN <=1 SENSITIVE Sensitive     IMIPENEM <=0.25 SENSITIVE Sensitive     NITROFURANTOIN <=16 SENSITIVE Sensitive     TRIMETH/SULFA >=320 RESISTANT Resistant     AMPICILLIN/SULBACTAM 4 SENSITIVE Sensitive     PIP/TAZO <=4 SENSITIVE Sensitive     * 10,000 COLONIES/mL ESCHERICHIA COLI     Scheduled Meds: Continuous Infusions:   Jmya Uliano, DO  [facility] Pager [phone]  If 7PM-7AM, please contact night-coverage [email or web address] Password [facility] 05/05/2015, 6:06  PM

## 2015-05-05 NOTE — Clinical Social Work Note (Signed)
Patient discharging today to [facility] via ambulance.   Genelle Bal, MSW, LCSW Licensed Clinical Social Worker Clinical Social Work Department [employer] [phone]

## 2015-05-05 NOTE — Consult Note (Signed)
Consultation Note Date: 05/05/2015   Patient Name: Bridget Cisneros  DOB: [birth year]  MRN: [id number]  Age / Sex: 82 y.o., female  PCP: Sharon Seller, NP Referring Physician: Catarina Hartshorn, MD  Reason for Consultation: Disposition, Establishing goals of care and Non pain symptom management  Clinical Assessment/Narrative: 82 yo woman with end stage dementia, mostly non-verbal, poor PO intake, malnutrition, bed bound. Admitted with dehydration and PNA. PMT consulted for goals of care.  Contacts/Participants in Discussion: Patients daughter and son Oncologist: daughter and son Relationship to Patient children HCPOA: yes  Son in [place] available by phone  SUMMARY OF RECOMMENDATIONS  Code Status/Advance Care Planning: DNR    Code Status Orders        Start     Ordered   05/01/15 2253  Do not attempt resuscitation (DNR)   Continuous    Question Answer Comment  In the event of cardiac or respiratory ARREST Do not call a "code blue"   In the event of cardiac or respiratory ARREST Do not perform Intubation, CPR, defibrillation or ACLS   In the event of cardiac or respiratory ARREST Use medication by any route, position, wound care, and other measures to relive pain and suffering. May use oxygen, suction and manual treatment of airway obstruction as needed for comfort.      05/01/15 2252    Code Status History    Date Active Date Inactive Code Status Order ID Comments User Context   03/29/2015  2:07 AM 03/31/2015 10:30 PM DNR [id number]  Lorretta Harp, MD ED    Advance Directive Documentation        Most Recent Value   Type of Advance Directive     Pre-existing out of facility DNR order (yellow form or pink MOST form)  Yellow form placed in chart (order not valid for inpatient use) [Family has at bedside.]   "MOST" Form in Place?        Other Directives:None  Symptom Management:   Palliative PRNs  ordered- main symptom is agitation. Will start haldol scheduled in low doses.  Aspiration: careful hand feeding   Palliative Prophylaxis:   Aspiration, Bowel Regimen, Delirium Protocol, Eye Care, Frequent Pain Assessment, Oral Care and Turn Reposition  Additional Recommendations (Limitations, Scope, Preferences):  Avoid Hospitalization, Full Comfort Care, Minimize Medications, Initiate Comfort Feeding, No Artificial Feeding, No Diagnostics, No IV Antibiotics, No IV Fluids and No Lab Draws  FULL COMFORT CARE- no life prolonging interventions  Psycho-social/Spiritual:  Support System: Strong Desire for further Chaplaincy support:no Additional Recommendations: Education on Hospice  Prognosis: < 2 weeks  Discharge Planning: Hospice facility   Chief Complaint/ Primary Diagnoses: Present on Admission:  . Essential hypertension, benign . Acute renal failure superimposed on stage 3 chronic kidney disease (HCC) . Alzheimer's disease . Acute encephalopathy . Dehydration . Leukocytosis . Acute renal failure (ARF) (HCC)  I have reviewed the medical record, interviewed the patient and family, and examined the patient. The following aspects are pertinent.  Past Medical History  Diagnosis Date  . Alzheimer's disease   . Hypertension   . Urinary retention    Social History   Social History  . Marital Status: Widowed    Spouse Name: N/A  . Number of Children: N/A  . Years of Education: N/A   Social History Main Topics  . Smoking status: Never Smoker   . Smokeless tobacco: None  . Alcohol Use: No  . Drug Use: No  . Sexual Activity: No  Other Topics Concern  . None   Social History Narrative   ** Merged History Encounter **       Family History  Problem Relation Age of Onset  . Hypertension Son   . Hypertension Daughter   . Diabetes Daughter    Scheduled Meds: . amLODipine  10 mg Oral Daily  . feeding supplement (ENSURE ENLIVE)  237 mL Oral BID PC  . heparin   5,000 Units Subcutaneous 3 times per day   Continuous Infusions:  PRN Meds:.acetaminophen **OR** acetaminophen, HYDROcodone-acetaminophen, ondansetron **OR** ondansetron (ZOFRAN) IV Medications Prior to Admission:  Prior to Admission medications   Medication Sig Start Date End Date Taking? Authorizing Provider  amLODipine (NORVASC) 10 MG tablet TAKE ONE TABLET BY MOUTH ONCE DAILY FOR BLOOD PRESSURE 02/15/15  Yes Sharon Seller, NP  AMBULATORY NON FORMULARY MEDICATION Depends Size Large Dx: 585.3 and 331.0 12/22/13   Oneal Grout, MD  feeding supplement, ENSURE ENLIVE, (ENSURE ENLIVE) LIQD Take 237 mLs by mouth 2 (two) times daily after a meal. Patient not taking: Reported on 05/01/2015 03/30/15   Nishant Dhungel, MD  HYDROcodone-acetaminophen (NORCO/VICODIN) 5-325 MG tablet Take 1-2 tablets by mouth every 4 (four) hours as needed for moderate pain. 05/04/15   Catarina Hartshorn, MD   No Known Allergies  Review of Systems  Physical Exam  Vital Signs: BP 106/78 mmHg  Pulse 80  Temp(Src) 98 F (36.7 C) (Axillary)  Resp 20  Wt 60.7 kg (133 lb 13.1 oz)  SpO2 98%  SpO2: SpO2: 98 % O2 Device:SpO2: 98 % O2 Flow Rate: .   IO: Intake/output summary:  Intake/Output Summary (Last 24 hours) at 05/05/15 0005 Last data filed at 05/04/15 1335  Gross per 24 hour  Intake    600 ml  Output    150 ml  Net    450 ml    LBM: Last BM Date: 05/03/15 Baseline Weight: Weight: 60.7 kg (133 lb 13.1 oz) Most recent weight: Weight: 60.7 kg (133 lb 13.1 oz)      Palliative Assessment/Data:  Flowsheet Rows        Most Recent Value   Intake Tab    Referral Department  Hospitalist   Unit at Time of Referral  Med/Surg Unit   Palliative Care Primary Diagnosis  Neurology   Date Notified  05/01/15   Palliative Care Type  New Palliative care   Reason for referral  Clarify Goals of Care   Date of Admission  05/01/15   Date first seen by Palliative Care  05/02/15   # of days Palliative referral response time   1 Day(s)   # of days IP prior to Palliative referral  0   Clinical Assessment    Psychosocial & Spiritual Assessment    Palliative Care Outcomes       Additional Data Reviewed:  CBC:    Component Value Date/Time   WBC 20.0* 05/02/2015 0614   WBC 9.7 06/03/2014 0812   HGB 11.8* 05/02/2015 0614   HCT 35.9* 05/02/2015 0614   PLT 319 05/02/2015 0614   MCV 80.9 05/02/2015 0614   NEUTROABS 18.8* 05/01/2015 1854   NEUTROABS 5.2 06/03/2014 0812   LYMPHSABS 3.0 05/01/2015 1854   LYMPHSABS 3.7* 06/03/2014 0812   MONOABS 0.9 05/01/2015 1854   EOSABS 0.0 05/01/2015 1854   EOSABS 0.3 06/03/2014 0812   BASOSABS 0.0 05/01/2015 1854   BASOSABS 0.0 06/03/2014 0812   Comprehensive Metabolic Panel:    Component Value Date/Time   NA 147* 05/02/2015  1610   NA 145* 06/03/2014 0812   K 4.8 05/02/2015 0614   CL 111 05/02/2015 0614   CO2 18* 05/02/2015 0614   BUN 136* 05/02/2015 0614   BUN 15 06/03/2014 0812   CREATININE 3.81* 05/02/2015 0614   GLUCOSE 110* 05/02/2015 0614   GLUCOSE 103* 06/03/2014 0812   CALCIUM 9.0 05/02/2015 0614   AST 35 05/02/2015 0614   ALT 22 05/02/2015 0614   ALKPHOS 86 05/02/2015 0614   BILITOT 0.5 05/02/2015 0614   BILITOT 0.4 06/03/2014 0812   PROT 7.1 05/02/2015 0614   PROT 6.7 06/03/2014 0812   ALBUMIN 2.7* 05/02/2015 0614   ALBUMIN 3.7 06/03/2014 0812     Time In: 2PM Time Out: 2:35PM  Time Total: 35 min Greater than 50%  of this time was spent counseling and coordinating care related to the above assessment and plan.  Signed by: Hilbert Odor, DO  05/05/2015, 12:05 AM  Please contact Palliative Medicine Team phone at [phone] for questions and concerns.

## 2015-05-05 NOTE — Progress Notes (Signed)
Called and gave report to staff of [facility]; report given to Nurse Danielle. Pt leaving unit via stretcher by ambulance service. Pt to d/c with foley d/t [facility].

## 2015-05-16 ENCOUNTER — Telehealth: Payer: Self-pay | Admitting: *Deleted

## 2015-05-16 NOTE — Telephone Encounter (Signed)
Patient is in [place]. Caregiver stated that patient doesn't need any medication refilled at this time.

## 2015-06-01 DEATH — deceased

## 2016-02-12 IMAGING — CT CT HEAD W/O CM
2 series · 16 of 30 positions shown, 20 images · non-contrast
Comparison: 03/28/2015

CLINICAL DATA: Altered mental status. Alzheimer's disease. Acute
encephalopathy.

EXAM:
CT HEAD WITHOUT CONTRAST
TECHNIQUE: Contiguous axial images were obtained from the base of the skull
through the vertex without intravenous contrast.

[Series 201: head w/o, idose (1) · axial · non-contrast · 0.40mm/px · z∈[+78,+198]mm · 13 of 30 slices shown, 17 images]
[im 3/30  brain]
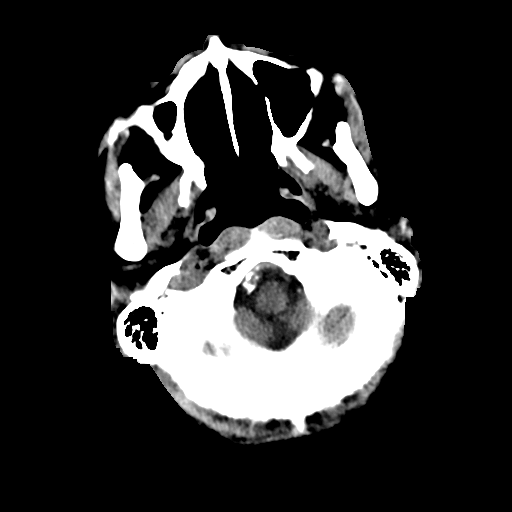
[im 3/30  bone]
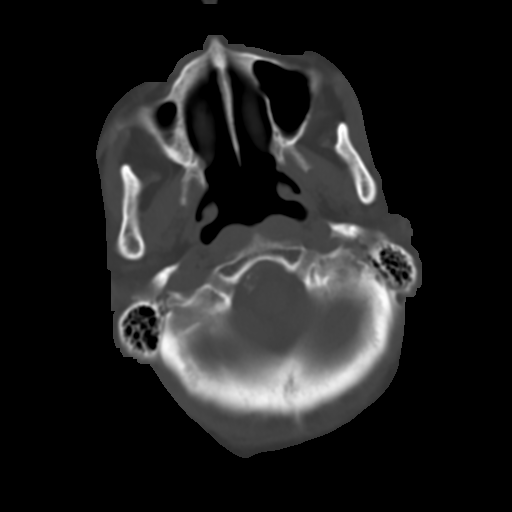
[im 5/30  brain]
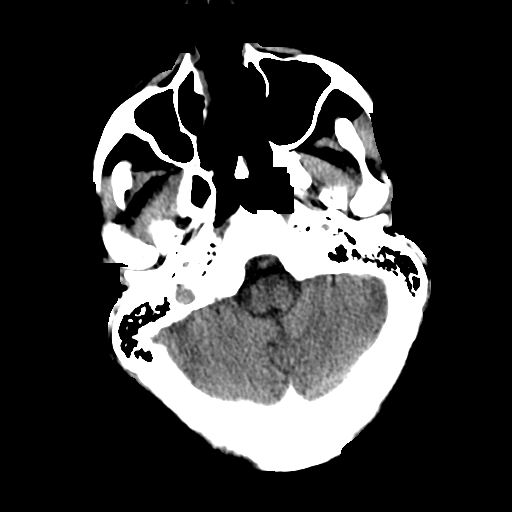
[im 7/30  brain]
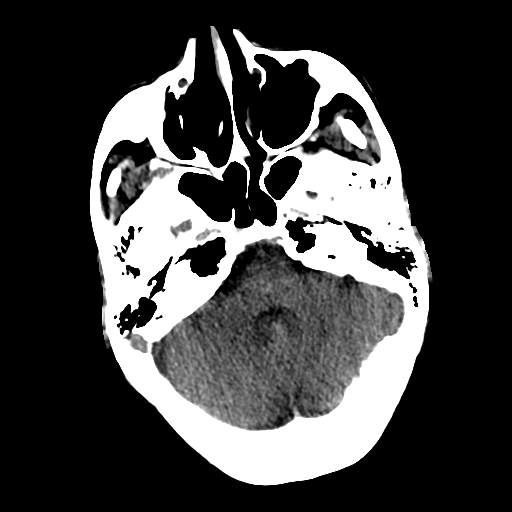
[im 9/30  brain]
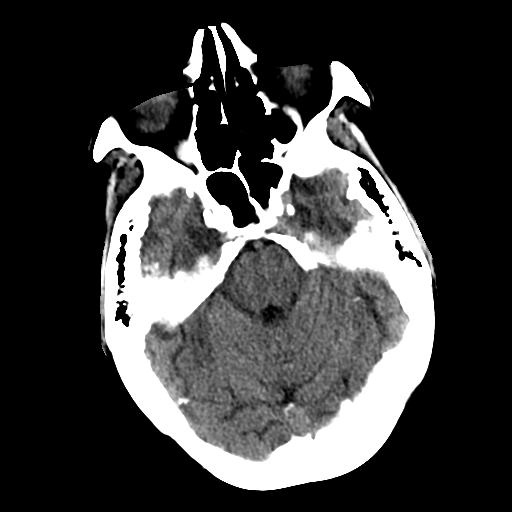
[im 11/30  brain]
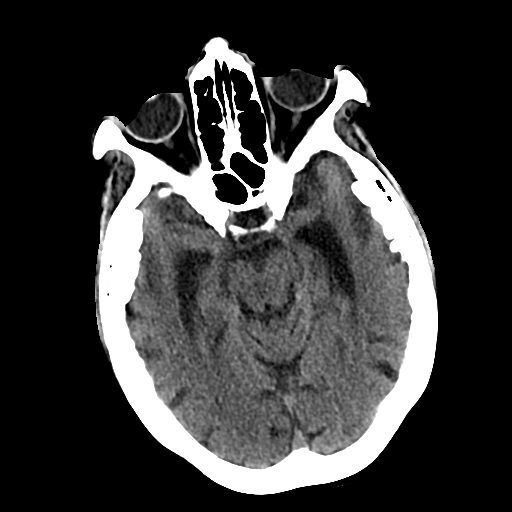
[im 11/30  bone]
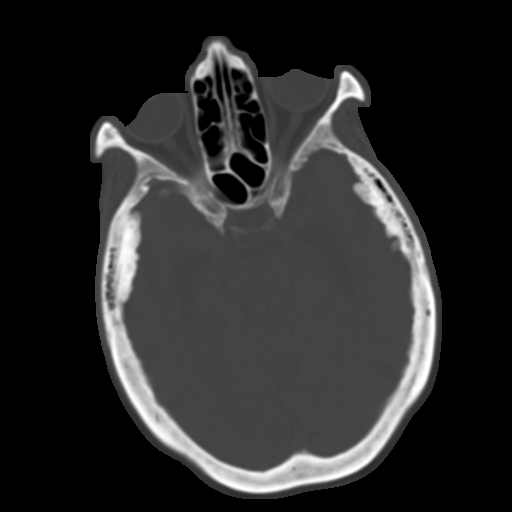
[im 13/30  brain]
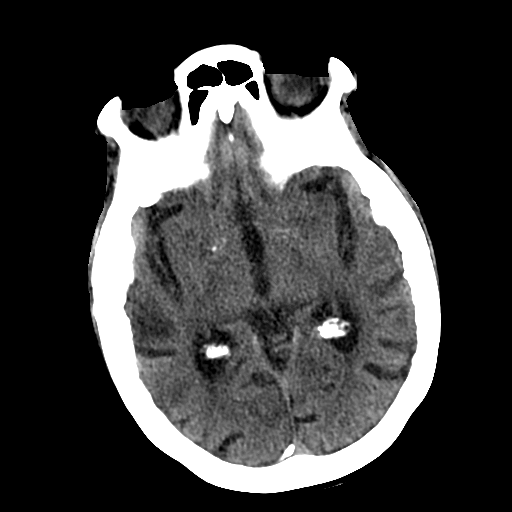
[im 15/30  brain]
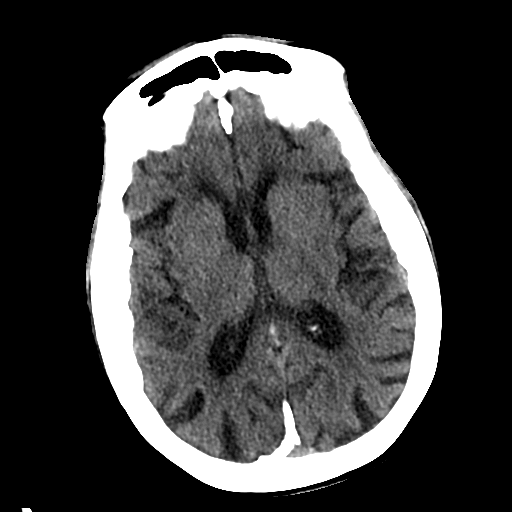
[im 17/30  brain]
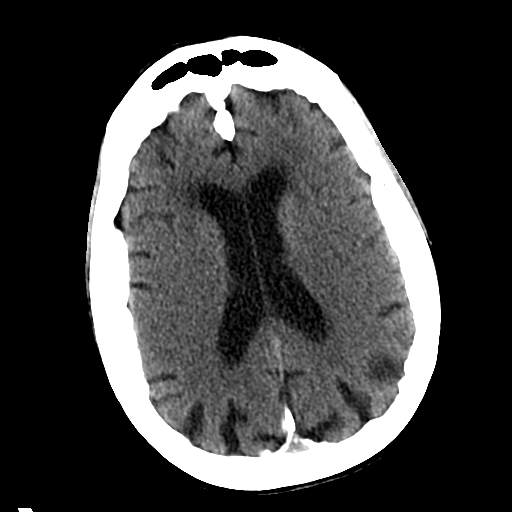
[im 19/30  brain]
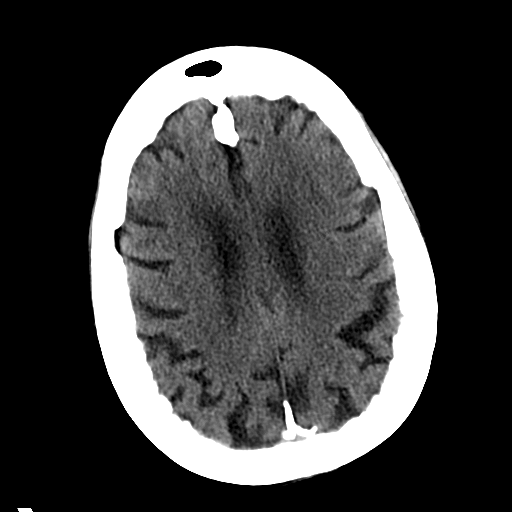
[im 19/30  bone]
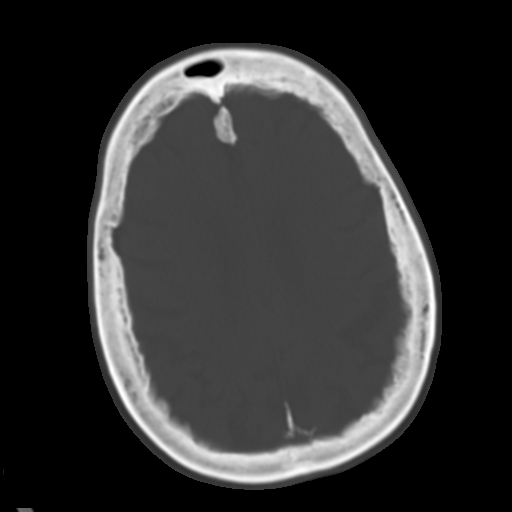
[im 21/30  brain]
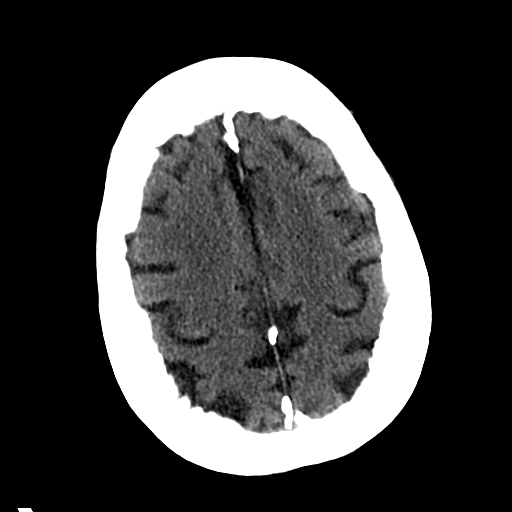
[im 23/30  brain]
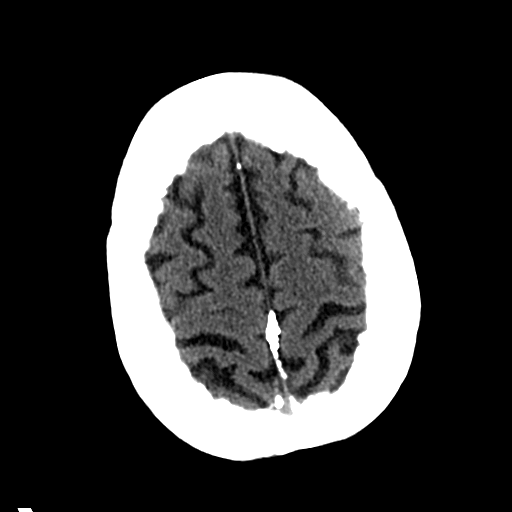
[im 25/30  brain]
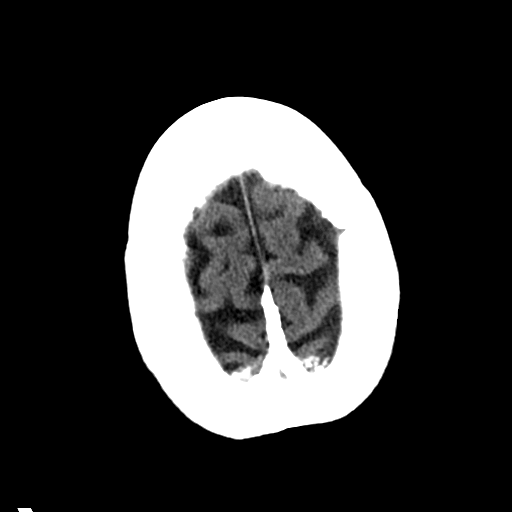
[im 27/30  brain]
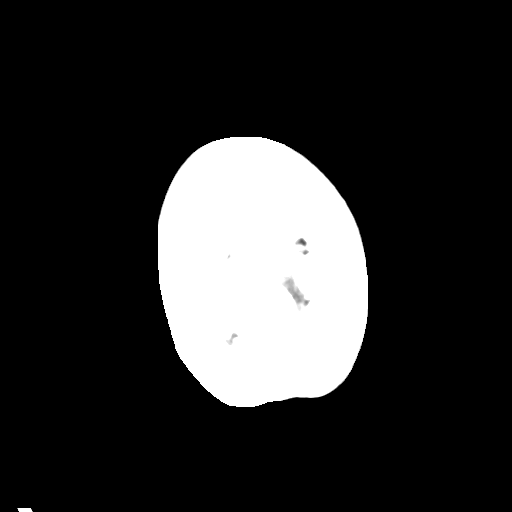
[im 27/30  bone]
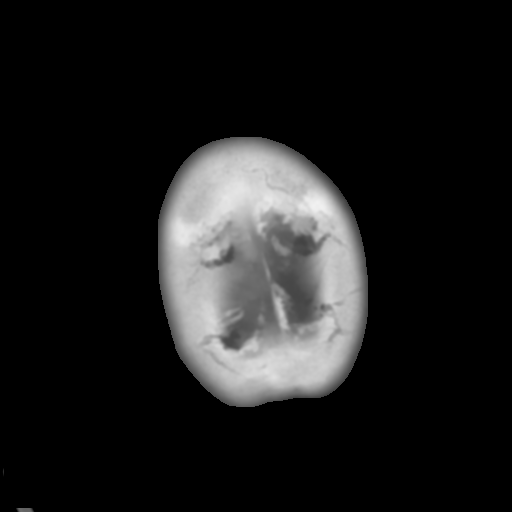

[Series 202: head w/o bone, idose (1) · axial · non-contrast · 0.40mm/px · z∈[+78,+118]mm · 3 of 30 slices shown]
[im 3/30  bone]
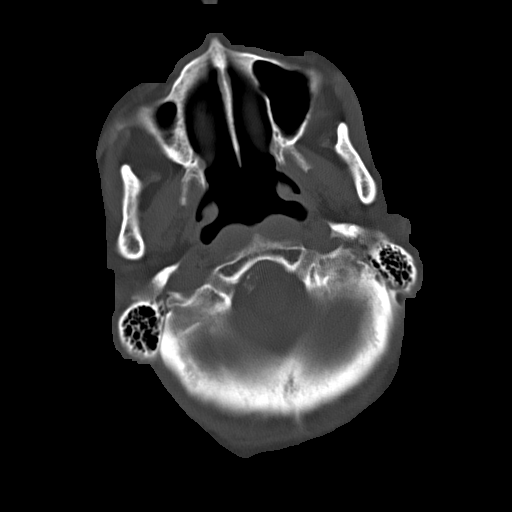
[im 7/30  bone]
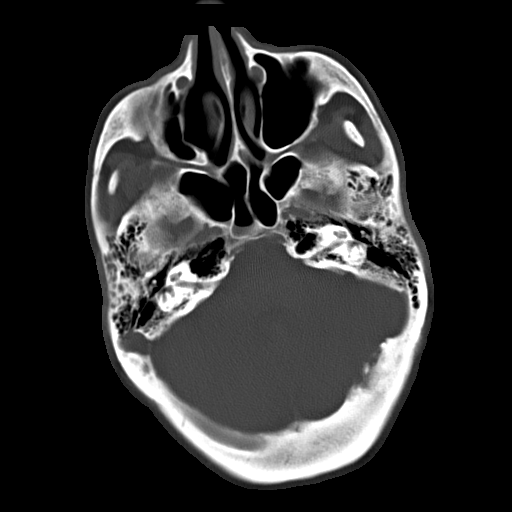
[im 11/30  bone]
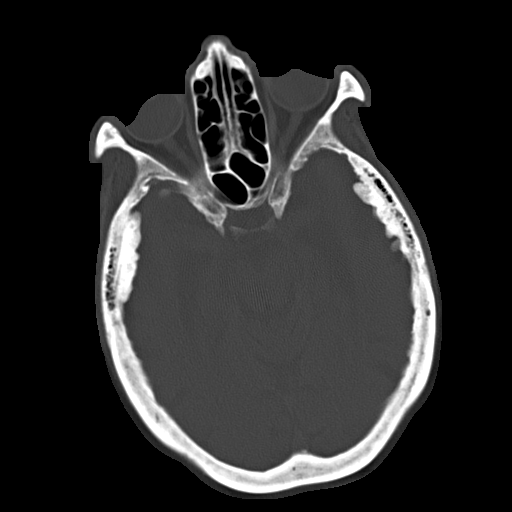

[16 of 30 positions shown; findings below may reference images not displayed]

FINDINGS: There is no evidence of intracranial hemorrhage, brain edema, or
other signs of acute infarction. There is no evidence of
intracranial mass lesion or mass effect. No abnormal extraaxial
fluid collections are identified.

Moderate diffuse cerebral atrophy and mild chronic small vessel
disease are stable in appearance. Ventricles are stable in size. No
skull abnormality identified.
IMPRESSION: No acute intracranial abnormality. Stable cerebral atrophy and
chronic small vessel disease.
